# Patient Record
Sex: Male | Born: 1955 | Race: White | Hispanic: No | Marital: Single | Smoking: Never smoker
Health system: Southern US, Community
[De-identification: ages and names within clinical notes are randomized; demographics above are authoritative.]

## PROBLEM LIST (undated history)

## (undated) DIAGNOSIS — E785 Hyperlipidemia, unspecified: Secondary | ICD-10-CM

## (undated) DIAGNOSIS — R41841 Cognitive communication deficit: Secondary | ICD-10-CM

## (undated) DIAGNOSIS — I96 Gangrene, not elsewhere classified: Secondary | ICD-10-CM

## (undated) DIAGNOSIS — F419 Anxiety disorder, unspecified: Secondary | ICD-10-CM

## (undated) DIAGNOSIS — E119 Type 2 diabetes mellitus without complications: Secondary | ICD-10-CM

## (undated) DIAGNOSIS — F32A Depression, unspecified: Secondary | ICD-10-CM

## (undated) DIAGNOSIS — M4800 Spinal stenosis, site unspecified: Secondary | ICD-10-CM

## (undated) DIAGNOSIS — M1990 Unspecified osteoarthritis, unspecified site: Secondary | ICD-10-CM

## (undated) DIAGNOSIS — T07XXXA Unspecified multiple injuries, initial encounter: Secondary | ICD-10-CM

## (undated) DIAGNOSIS — I10 Essential (primary) hypertension: Secondary | ICD-10-CM

## (undated) DIAGNOSIS — R269 Unspecified abnormalities of gait and mobility: Secondary | ICD-10-CM

## (undated) DIAGNOSIS — G4733 Obstructive sleep apnea (adult) (pediatric): Secondary | ICD-10-CM

## (undated) DIAGNOSIS — R296 Repeated falls: Secondary | ICD-10-CM

## (undated) HISTORY — DX: Hyperlipidemia, unspecified: E78.5

## (undated) HISTORY — DX: Obstructive sleep apnea (adult) (pediatric): G47.33

## (undated) HISTORY — DX: Unspecified multiple injuries, initial encounter: T07.XXXA

## (undated) HISTORY — DX: Type 2 diabetes mellitus without complications: E11.9

## (undated) HISTORY — DX: Essential (primary) hypertension: I10

## (undated) HISTORY — DX: Gangrene, not elsewhere classified: I96

## (undated) HISTORY — DX: Unspecified abnormalities of gait and mobility: R26.9

## (undated) HISTORY — DX: Repeated falls: R29.6

## (undated) HISTORY — DX: Cognitive communication deficit: R41.841

## (undated) HISTORY — DX: Anxiety disorder, unspecified: F41.9

## (undated) HISTORY — DX: Spinal stenosis, site unspecified: M48.00

---

## 2006-01-03 ENCOUNTER — Ambulatory Visit (HOSPITAL_COMMUNITY): Admission: RE | Admit: 2006-01-03 | Discharge: 2006-01-03 | Payer: Self-pay | Admitting: General Surgery

## 2006-04-20 ENCOUNTER — Ambulatory Visit (HOSPITAL_COMMUNITY): Admission: RE | Admit: 2006-04-20 | Discharge: 2006-04-20 | Payer: Self-pay | Admitting: General Surgery

## 2021-11-18 ENCOUNTER — Encounter (HOSPITAL_COMMUNITY): Payer: Self-pay

## 2021-11-18 ENCOUNTER — Emergency Department (HOSPITAL_COMMUNITY): Payer: Medicare PPO

## 2021-11-18 ENCOUNTER — Emergency Department (HOSPITAL_COMMUNITY)
Admission: EM | Admit: 2021-11-18 | Discharge: 2021-11-18 | Disposition: A | Discharge status: Home / Self Care | Payer: Medicare PPO | Attending: Emergency Medicine | Admitting: Emergency Medicine

## 2021-11-18 ENCOUNTER — Other Ambulatory Visit: Payer: Self-pay

## 2021-11-18 DIAGNOSIS — E871 Hypo-osmolality and hyponatremia: Secondary | ICD-10-CM | POA: Insufficient documentation

## 2021-11-18 DIAGNOSIS — M87051 Idiopathic aseptic necrosis of right femur: Secondary | ICD-10-CM | POA: Insufficient documentation

## 2021-11-18 DIAGNOSIS — L089 Local infection of the skin and subcutaneous tissue, unspecified: Secondary | ICD-10-CM | POA: Diagnosis present

## 2021-11-18 DIAGNOSIS — M87077 Idiopathic aseptic necrosis of right toe(s): Secondary | ICD-10-CM | POA: Diagnosis not present

## 2021-11-18 DIAGNOSIS — E119 Type 2 diabetes mellitus without complications: Secondary | ICD-10-CM | POA: Insufficient documentation

## 2021-11-18 DIAGNOSIS — M25551 Pain in right hip: Secondary | ICD-10-CM | POA: Insufficient documentation

## 2021-11-18 DIAGNOSIS — M25571 Pain in right ankle and joints of right foot: Secondary | ICD-10-CM | POA: Insufficient documentation

## 2021-11-18 DIAGNOSIS — D72829 Elevated white blood cell count, unspecified: Secondary | ICD-10-CM | POA: Insufficient documentation

## 2021-11-18 DIAGNOSIS — L02419 Cutaneous abscess of limb, unspecified: Secondary | ICD-10-CM

## 2021-11-18 DIAGNOSIS — L03119 Cellulitis of unspecified part of limb: Secondary | ICD-10-CM

## 2021-11-18 LAB — BASIC METABOLIC PANEL
Anion gap: 10 (ref 5–15)
BUN: 19 mg/dL (ref 8–23)
CO2: 25 mmol/L (ref 22–32)
Calcium: 8.5 mg/dL — ABNORMAL LOW (ref 8.9–10.3)
Chloride: 97 mmol/L — ABNORMAL LOW (ref 98–111)
Creatinine, Ser: 0.81 mg/dL (ref 0.61–1.24)
GFR, Estimated: >60 mL/min (ref >60)
Glucose, Bld: 139 mg/dL — ABNORMAL HIGH (ref 70–99)
Potassium: 4.1 mmol/L (ref 3.5–5.1)
Sodium: 132 mmol/L — ABNORMAL LOW (ref 135–145)

## 2021-11-18 LAB — CBC WITH DIFFERENTIAL/PLATELET
Abs Immature Granulocytes: 0.7 10*3/uL — ABNORMAL HIGH (ref 0.00–0.07)
Basophils Absolute: 0.2 10*3/uL — ABNORMAL HIGH (ref 0.0–0.1)
Basophils Relative: 1 %
Eosinophils Absolute: 0.1 10*3/uL (ref 0.0–0.5)
Eosinophils Relative: 1 %
HCT: 48.9 % (ref 39.0–52.0)
Hemoglobin: 16.3 g/dL (ref 13.0–17.0)
Immature Granulocytes: 3 %
Lymphocytes Relative: 9 %
Lymphs Abs: 1.9 10*3/uL (ref 0.7–4.0)
MCH: 29 pg (ref 26.0–34.0)
MCHC: 33.3 g/dL (ref 30.0–36.0)
MCV: 87 fL (ref 80.0–100.0)
Monocytes Absolute: 1.2 10*3/uL — ABNORMAL HIGH (ref 0.1–1.0)
Monocytes Relative: 6 %
Neutro Abs: 17.7 10*3/uL — ABNORMAL HIGH (ref 1.7–7.7)
Neutrophils Relative %: 80 %
Platelets: 446 10*3/uL — ABNORMAL HIGH (ref 150–400)
RBC: 5.62 MIL/uL (ref 4.22–5.81)
RDW: 13 % (ref 11.5–15.5)
WBC: 21.8 10*3/uL — ABNORMAL HIGH (ref 4.0–10.5)
nRBC: 0 % (ref 0.0–0.2)

## 2021-11-18 LAB — LACTIC ACID, PLASMA
Lactic Acid, Venous: 1.8 mmol/L (ref 0.5–1.9)
Lactic Acid, Venous: 1.3 mmol/L (ref 0.5–1.9)

## 2021-11-18 LAB — SEDIMENTATION RATE: Sed Rate: 31 mm/hr — ABNORMAL HIGH (ref 0–16)

## 2021-11-18 MED ORDER — CLINDAMYCIN HCL 150 MG PO CAPS
300.0000 mg | ORAL_CAPSULE | Freq: Once | ORAL | Status: AC
  Administered 2021-11-18: 300 mg via ORAL
  Filled 2021-11-18: qty 2

## 2021-11-18 MED ORDER — VANCOMYCIN HCL IN DEXTROSE 1-5 GM/200ML-% IV SOLN
1000.0000 mg | Freq: Once | INTRAVENOUS | Status: AC
  Administered 2021-11-18: 1000 mg via INTRAVENOUS
  Filled 2021-11-18: qty 200

## 2021-11-18 MED ORDER — PIPERACILLIN-TAZOBACTAM 3.375 G IVPB: 3.3750 g | Freq: Three times a day (TID) | INTRAVENOUS | Status: DC

## 2021-11-18 MED ORDER — METRONIDAZOLE 500 MG/100ML IV SOLN
500.0000 mg | Freq: Once | INTRAVENOUS | Status: AC
Start: 2021-11-18 — End: 2021-11-18
  Administered 2021-11-18: 500 mg via INTRAVENOUS
  Filled 2021-11-18: qty 100

## 2021-11-18 MED ORDER — PIPERACILLIN-TAZOBACTAM 4.5 G IVPB
4.5000 g | Freq: Once | INTRAVENOUS | Status: DC
Start: 2021-11-18 — End: 2021-11-19
  Filled 2021-11-18: qty 100

## 2021-11-18 MED ORDER — IOHEXOL 300 MG/ML  SOLN
100.0000 mL | Freq: Once | INTRAMUSCULAR | Status: AC | PRN
  Administered 2021-11-18: 100 mL via INTRAVENOUS

## 2021-11-18 MED ORDER — SODIUM CHLORIDE 0.9 % IV SOLN: Freq: Once | INTRAVENOUS | Status: DC

## 2021-11-18 MED ORDER — SODIUM CHLORIDE 0.9 % IV SOLN
2.0000 g | Freq: Once | INTRAVENOUS | Status: AC
  Administered 2021-11-18: 2 g via INTRAVENOUS
  Filled 2021-11-18: qty 12.5

## 2021-11-18 NOTE — ED Notes (Signed)
Patient transported to CT 

## 2021-11-18 NOTE — Consult Note (Signed)
Consulted for admission in regards to right hip, heel, and right big toe wounds that were noted on admission to ALF.  CT imaging of the hip demonstrates some superficial gas and fluid collections.  Patient noted to have significant leukocytosis and was started on cefepime and vancomycin.  Asked ED provider to discuss case with orthopedics who states that this could be significant for necrotizing fasciitis and request that patient be transferred to [facility] for further evaluation and management.  Total care time: 25 minutes.

## 2021-11-18 NOTE — ED Provider Notes (Cosign Needed Addendum)
[facility] EMERGENCY DEPARTMENT Provider Note   CSN: [id number] Arrival date & time: 11/18/21  1403     History  Chief Complaint  Patient presents with   Lesions on Body    Curtis Hansen is a 66 y.o. male.  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  They are unsure of how long this has been present but patient believes the toe lesion has been present for about a month.  He denies fever, chills or other complaints.  Patient was getting a shower at the nursing home facility when they noted these findings and had him come to the emergency room for further evaluation.  The history is provided by the patient. No language interpreter was used.       Home Medications Prior to Admission medications   Not on File      Allergies    Patient has no allergy information on record.    Review of Systems   Review of Systems  Constitutional:  Negative for chills and fever.  Musculoskeletal:  Positive for arthralgias.  Skin:  Positive for wound.  All other systems reviewed and are negative.   Physical Exam Updated Vital Signs BP 109/86   Pulse 88   Temp 97.7 F (36.5 C) (Oral)   Resp (!) 21   Ht '5\' 10"'$  (1.778 m)   Wt 86.2 kg   SpO2 100%   BMI 27.26 kg/m  Physical Exam Vitals and nursing note reviewed.  Constitutional:      General: He is not in acute distress.    Appearance: Normal appearance. He is not ill-appearing.  HENT:     Head: Normocephalic and atraumatic.     Nose: Nose normal.  Eyes:     General: No scleral icterus.    Extraocular Movements: Extraocular movements intact.     Conjunctiva/sclera: Conjunctivae normal.  Cardiovascular:     Rate and Rhythm: Normal rate and regular rhythm.     Pulses: Normal pulses.     Heart sounds:  Normal heart sounds.  Pulmonary:     Effort: Pulmonary effort is normal. No respiratory distress.     Breath sounds: Normal breath sounds. No wheezing or rales.  Abdominal:     General: There is no distension.     Tenderness: There is no abdominal tenderness.  Musculoskeletal:        General: Normal range of motion.     Cervical back: Normal range of motion.  Skin:    General: Skin is warm and dry.     Comments: Wound noted to right great toe.  Necrotic area.  Ulcer noted to right hip.  Unstageable.  Heel wound noted.  See attached images for further description.  Erythema noted to right groin.  Neurological:     General: No focal deficit present.     Mental Status: He is alert. Mental status is at baseline.             ED Results / Procedures / Treatments   Labs (all labs ordered are listed, but only abnormal results are displayed) Labs Reviewed  CBC WITH DIFFERENTIAL/PLATELET  BASIC METABOLIC PANEL  SEDIMENTATION RATE  LACTIC ACID, PLASMA  LACTIC ACID, PLASMA    EKG None  Radiology No results found.  Procedures Procedures  Medications Ordered in ED Medications - No data to display  ED Course/ Medical Decision Making/ A&P                           Medical Decision Making Amount and/or Complexity of Data Reviewed Labs: ordered. Radiology: ordered.  Risk Prescription drug management. Decision regarding hospitalization.   66 year old male with past medical history of diabetes presents today from nursing home facility for evaluation of necrosis of the right great toe, right hip for unknown duration.  Patient was undergoing admission evaluation at the nursing home with these wounds were discovered and he was referred to the emergency room for further evaluation.  He denies fever or pain.  CBC shows leukocytosis of 21.8 with left shift.  BMP shows glucose of 139 otherwise without acute findings.  Lactic acid initially of 1.8.  Sed rate of 31.  Right foot  x-ray did not show any bony injury.  Right hip x-ray showed gas bubbles around the right hip.  This was followed up with CT pelvis with contrast which showed superficial gas.  Initially the case was discussed with hospitalist who accepted patient for admission and recommended discussion with Ortho on-call.  Orthopedist states given the presence of gas this is concerning for necrotizing fasciitis and patient can deteriorate suddenly and he recommends transfer to [facility].  Case was discussed with on-call general surgeon at [facility] who accepts patient as a transfer to [facility] as an ED to ED.  Notified nurse to ensure patient travels with disc of the CT scan as well as the CT read.  Antibiotics started.  Maintenance fluids started.  Patient and brother who is at bedside are aware of this.   Final Clinical Impression(s) / ED Diagnoses Final diagnoses:  Soft tissue infection    Rx / DC Orders ED Discharge Orders     None         Evlyn Courier, PA-C 11/18/21 2005    [address], [place], [place] 11/18/21 2053

## 2021-11-18 NOTE — Progress Notes (Signed)
Pharmacy Antibiotic Note  Curtis Hansen is a 66 y.o. male admitted on 11/18/2021 with  necrotizing fasciitis .  Pharmacy has been consulted for zosyn dosing.  Plan: Zosyn 4.5 gram iv X1 dose f/b Zosyn 3.375g IV q8h (4 hour infusion).  Height: '5\' 10"'$  (177.8 cm) Weight: 86.2 kg (190 lb) IBW/kg (Calculated) : 73  Temp (24hrs), Avg:97.7 F (36.5 C), Min:97.7 F (36.5 C), Max:97.7 F (36.5 C)  Recent Labs  Lab 11/18/21 1601 11/18/21 1603 11/18/21 1807  WBC 21.8*  --   --   CREATININE 0.81  --   --   LATICACIDVEN  --  1.8 1.3    Estimated Creatinine Clearance: 93.9 mL/min (by C-G formula based on SCr of 0.81 mg/dL).    Not on File  Antimicrobials this admission: 6/21 cefepime >> 6/21 6/21 clindamycin >> 6/21 6/21 flagyl >> 6/21 6/21 Vanco >> 6/21 6/21 Zosyn >>   Thank you for allowing pharmacy to be a part of this patient's care.  Vaughan Basta BS, PharmD, BCPS Clinical Pharmacist 11/18/2021 7:52 PM  Contact: [phone] after 3 PM  "Be curious, not judgmental..." -Jamal Maes

## 2021-11-18 NOTE — ED Triage Notes (Signed)
Patient brought by [facility] from [facility] after being evaluated for admission to the facility.  Staff was giving the patient a shower and noticed the patient had lesions on his right hip, right heel, and right big toe.  Wanted the patient to be evaluated before admission.

## 2022-02-15 ENCOUNTER — Encounter (INDEPENDENT_AMBULATORY_CARE_PROVIDER_SITE_OTHER): Payer: Self-pay

## 2022-02-15 ENCOUNTER — Ambulatory Visit (INDEPENDENT_AMBULATORY_CARE_PROVIDER_SITE_OTHER): Payer: Medicare Other | Admitting: Gastroenterology

## 2022-02-15 ENCOUNTER — Encounter (INDEPENDENT_AMBULATORY_CARE_PROVIDER_SITE_OTHER): Payer: Self-pay | Admitting: Gastroenterology

## 2022-02-15 VITALS — BP 145/87 | HR 88 | Temp 98.4°F | Ht 70.0 in | Wt 180.0 lb

## 2022-02-15 DIAGNOSIS — K5900 Constipation, unspecified: Secondary | ICD-10-CM | POA: Diagnosis not present

## 2022-02-15 DIAGNOSIS — Z1211 Encounter for screening for malignant neoplasm of colon: Secondary | ICD-10-CM | POA: Diagnosis not present

## 2022-02-15 DIAGNOSIS — Z86010 Personal history of colonic polyps: Secondary | ICD-10-CM

## 2022-02-15 NOTE — Progress Notes (Signed)
Referring Provider: No ref. provider found Primary Care Physician:  Moss Mc, NP Primary GI Physician: new  Chief Complaint  Patient presents with   Abdominal Pain    [facility]has a lot of gas.    HPI:   Curtis Hansen is a 65 y.o. male with past medical history of anxiety, DM, HLD, HTN, OSA, spinal stenosis  Patient presenting today as a new patient for abdominal pain and constipation.   Most recent labs: 11/27/21 with unremarkable CBC, hgb 14.8, 11/24/21 TSH 4.26  recent KUB with presence of moderate amount of stool in colon. Current med list with lactulose 10g TID with PRN dulcolax '10mg'$  suppository, tramadol '50mg'$  q6h, and oxycodone '5mg'$  tablets q6h.  Patient reports constipation and feeling gassy for the past few months. Has gone as long as a week without a BM. He does endorse that he has had changes in his medications here and there but unable to endorse which ones these were. States that he had been told he could get a suppository daily to help with a BM, Saturday he received a suppository and he had 6 good BMs. Lactulose is listed on med list for TID but he states he is only getting this once a day, did not get it Saturday and Sunday. He had a couple smaller BMs yesterday. Denies rectal bleeding or melena. Has some lower abdominal pain that improves with a BM. He does report he gets weighed at the beginning of each month, this month he had a loss of 10 pounds, states he does get his friends to bring him food. He notes he went to [facility] 11/28/21, has had decrease in clothes size over the past 6 months. Does endorse he has had a lot of issues with cellulitis and wounds over the past few months which is why he had to go to nursing facility. States appetite is okay though is somewhat less than when he initially went to nursing facility. He states that he tries to avoid taking oxycodone or tramadol anymore than he  needs to, per patient takes this maybe a couple of times per week, though per mar it appears he takes this multiple days per week. Denies nausea or vomiting, dysphagia or odynophagia.   He reports that he had a colonoscopy 2 years ago and was advised to have it repeated in 2 years due to polyps, no report available though per chart review has history of colonic adenomas .   NSAID use: none Social hx: no etoh or tobacco  Fam hx: no crc, lvier disease, or pancreatic cancer   Last Colonoscopy: Dr. Anne Ng, [place], 2 years ago per patient, recommended to have repeat in 2 years  Last Endoscopy: never  Recommendations:    Past Medical History:  Diagnosis Date   Abnormality of gait    Anxiety    Cognitive communication deficit    Diabetes ([place])    Gangrene ([place])    Hyperlipemia    Hypertension    Multiple fractures    right ribs   OSA (obstructive sleep apnea)    Repeated falls    Spinal stenosis     No past surgical history on file.  Current Outpatient Medications  Medication Sig Dispense Refill   ACETAMINOPHEN ER PO Take 325 mg by mouth. Take 2 tablets every 6 hours prn     aspirin EC 81 MG tablet Take 81 mg by mouth daily.  atorvastatin (LIPITOR) 40 MG tablet Take 1 tablet by mouth at bedtime.     bisacodyl (DULCOLAX) 10 MG suppository Place 10 mg rectally daily as needed for moderate constipation.     cetirizine (ZYRTEC) 10 MG tablet Take by mouth.     clopidogrel (PLAVIX) 75 MG tablet Take 75 mg by mouth daily.     fenofibrate 160 MG tablet Take 1 tablet by mouth daily.     insulin lispro (HUMALOG) 100 UNIT/ML injection Inject into the skin 3 (three) times daily before meals. Sliding scale     lactulose (CHRONULAC) 10 GM/15ML solution Take by mouth 3 (three) times daily. 30 ml one time daily for constipation     lidocaine (LIDODERM) 5 % Place 1 patch onto the skin daily. Remove & Discard patch within 12 hours or as directed by MD     metoprolol succinate  (TOPROL-XL) 25 MG 24 hr tablet Take 12.5 mg by mouth daily.     Nutritional Supplements (ENSURE PO) Take by mouth. One three times a day - 8 ounces     OVER THE COUNTER MEDICATION Melatonin '3mg'$  one qhs     OxyCODONE HCl, Abuse Deter, (OXAYDO) 5 MG TABA Take by mouth. One every 6 hours prn     pregabalin (LYRICA) 75 MG capsule Take by mouth.     Semaglutide (RYBELSUS) 7 MG TABS Take by mouth.     senna (SENOKOT) 8.6 MG TABS tablet Take 1 tablet by mouth. 2 bid     simethicone (MYLICON) 80 MG chewable tablet Chew 80 mg by mouth. One tablet by mouth before meals and bedtime     sodium chloride 1 g tablet Take 1 g by mouth daily.     tamsulosin (FLOMAX) 0.4 MG CAPS capsule tamsulosin 0.4 mg capsule  TAKE ONE CAPSULE BY MOUTH once daily     traMADol (ULTRAM) 50 MG tablet Take 1 tablet by mouth every 6 (six) hours as needed.     traZODone (DESYREL) 50 MG tablet Take 50 mg by mouth at bedtime.     zinc gluconate 50 MG tablet Take 50 mg by mouth daily.     No current facility-administered medications for this visit.    Allergies as of 02/15/2022   (No Known Allergies)    No family history on file.  Social History   Socioeconomic History   Marital status: Single    Spouse name: Not on file   Number of children: Not on file   Years of education: Not on file   Highest education level: Not on file  Occupational History   Not on file  Tobacco Use   Smoking status: Never    Passive exposure: Never   Smokeless tobacco: Never  Substance and Sexual Activity   Alcohol use: Never   Drug use: Never   Sexual activity: Not on file  Other Topics Concern   Not on file  Social History Narrative   Not on file   Social Determinants of Health   Financial Resource Strain: Not on file  Food Insecurity: Not on file  Transportation Needs: Not on file  Physical Activity: Not on file  Stress: Not on file  Social Connections: Not on file   Review of systems General: negative for malaise, night  sweats, fever, chills, +weight loss Neck: Negative for lumps, goiter, pain and significant neck swelling Resp: Negative for cough, wheezing, dyspnea at rest CV: Negative for chest pain, leg swelling, palpitations, orthopnea GI: denies melena, hematochezia, nausea, vomiting,  diarrhea, dysphagia, odyonophagia, early satiety or unintentional weight loss. +constipation +flatulence MSK: Negative for joint pain or swelling, back pain, and muscle pain. Derm: Negative for itching or rash Psych: Denies depression, anxiety, memory loss, confusion. No homicidal or suicidal ideation.  Heme: Negative for prolonged bleeding, bruising easily, and swollen nodes. Endocrine: Negative for cold or heat intolerance, polyuria, polydipsia and goiter. Neuro: negative for tremor, gait imbalance, syncope and seizures. The remainder of the review of systems is noncontributory.  Physical Exam: BP (!) 145/87 (BP Location: Left Arm, Patient Position: Sitting, Cuff Size: Large)   Pulse 88   Temp 98.4 F (36.9 C) (Oral)   Ht '5\' 10"'$  (1.778 m)   Wt 180 lb (81.6 kg) Comment: per patient. unable to weight today.  BMI 25.83 kg/m  General:   Alert and oriented. No distress noted. Pleasant and cooperative. Pt in wheelchair Head:  Normocephalic and atraumatic. Eyes:  Conjuctiva clear without scleral icterus. Mouth:  Oral mucosa pink and moist. Good dentition. No lesions. Heart: Normal rate and rhythm, s1 and s2 heart sounds present.  Lungs: Clear lung sounds in all lobes. Respirations equal and unlabored. Abdomen:  +BS, soft, non-tender and non-distended. No rebound or guarding. No HSM or masses noted. Derm: No palmar erythema or jaundice Msk:  Symmetrical without gross deformities. Normal posture. Extremities:  Without edema. Neurologic:  Alert and  oriented x4 Psych:  Alert and cooperative. Normal mood and affect.  Invalid input(s): "6 MONTHS"   ASSESSMENT: EEAN BUSS is a 66 y.o. male presenting today as a new  patient for constipation and to schedule colonoscopy.  Constipation for the past few months, notably on opiate pain medications that he appears to be taking multiple times per week. Having BMs only with the use of dulcolax suppositories. No rectal bleeding or melena. Abdominal pain that improves with BM. Recent Thyroid panel was WNL. Suspect constipation is opiate induced. Discussed starting miralax 17g BID x1 week, if no improvement in frequency of BMs, recommend increasing to 17g q8h. Can use dulcolax suppositories PRN, however, if he is not having improved frequency in BMs, may need to consider prescription strength constipation treatment such as Movantik given opiates.   Patient reports last colonoscopy was 2 years ago and was told to repeat again in 2 years due to polyps, no report available though per chart review, history of colonic adenomas. Will proceed with screening colonoscopy.   Indications, risks and benefits of procedure discussed in detail with patient. Patient verbalized understanding and is in agreement to proceed with Colonoscopy at this time.    PLAN:  Schedule colonoscopy, 2 day prep, ASA III 2. Start taking Miralax 1 capful  q12 hours for one week. If bowel movements do not improve, increase to 1 capful every 8 hours.  3. Dulcolax suppository PRN 4. Consider Movantik if miralax is not providing results  5. Increase water and fiber intake  All questions were answered, patient verbalized understanding and is in agreement with plan as outlined above.    Follow Up: 6 months   Jonnae Fonseca L. Alver Sorrow, MSN, APRN, AGNP-C Adult-Gerontology Nurse Practitioner [facility]

## 2022-02-15 NOTE — Patient Instructions (Signed)
It was nice to meet you! We will get you scheduled for colonoscopy Start taking Miralax 17g 12 hours. If after 1 week there is no improvement, increase to 1 capful every 8 hours Please make sure you are staying well hydrated and eating diet high in fiber as you are able  Follow up 6 months

## 2022-02-17 ENCOUNTER — Telehealth (INDEPENDENT_AMBULATORY_CARE_PROVIDER_SITE_OTHER): Payer: Self-pay

## 2022-02-17 NOTE — Telephone Encounter (Signed)
Per Dr Renard Matter at [facility] dob 01-19-2056 may hold Plavix 5 days prior to procedure and patient is aware

## 2022-03-10 ENCOUNTER — Other Ambulatory Visit (HOSPITAL_COMMUNITY): Payer: Self-pay | Admitting: Orthopedic Surgery

## 2022-03-10 ENCOUNTER — Ambulatory Visit (HOSPITAL_COMMUNITY)
Admission: RE | Admit: 2022-03-10 | Discharge: 2022-03-10 | Disposition: A | Discharge status: Home / Self Care | Payer: Medicare Other | Source: Ambulatory Visit | Attending: Orthopedic Surgery | Admitting: Orthopedic Surgery

## 2022-03-10 DIAGNOSIS — R531 Weakness: Secondary | ICD-10-CM

## 2022-03-31 ENCOUNTER — Other Ambulatory Visit (INDEPENDENT_AMBULATORY_CARE_PROVIDER_SITE_OTHER): Payer: Self-pay

## 2022-03-31 DIAGNOSIS — Z1211 Encounter for screening for malignant neoplasm of colon: Secondary | ICD-10-CM

## 2022-04-01 ENCOUNTER — Other Ambulatory Visit (HOSPITAL_COMMUNITY): Payer: Self-pay | Admitting: Neurological Surgery

## 2022-04-01 ENCOUNTER — Encounter (HOSPITAL_COMMUNITY)
Admission: RE | Admit: 2022-04-01 | Discharge: 2022-04-01 | Disposition: A | Discharge status: Home / Self Care | Payer: Medicare Other | Source: Ambulatory Visit | Attending: Gastroenterology | Admitting: Gastroenterology

## 2022-04-01 ENCOUNTER — Encounter (HOSPITAL_COMMUNITY): Payer: Self-pay

## 2022-04-01 ENCOUNTER — Other Ambulatory Visit: Payer: Self-pay

## 2022-04-01 DIAGNOSIS — M47816 Spondylosis without myelopathy or radiculopathy, lumbar region: Secondary | ICD-10-CM

## 2022-04-01 NOTE — Pre-Procedure Instructions (Signed)
Called GI office. Spoke with leigh ann. Will call facility to reschedule procedure.

## 2022-04-01 NOTE — Progress Notes (Signed)
Called [facility] to speak to nurse regarding pat information for pt. Placed on hold. No answer. Will call at a later time.

## 2022-04-01 NOTE — Progress Notes (Signed)
Spoke to [employer]. Stated facility nurse gave pt his plavix today at 8 am. Was supposed to stop medication on 03/31/22. Jenetta Downer MD notified. Stated pt will need to be rescheduled for procedure and told to hold plavix for 5 days. Will called facility and let them know.

## 2022-04-01 NOTE — Progress Notes (Signed)
Spoke with Martin Majestic regarding pt's pat phone call. Dicussed information. Faxed letter for colon prep information to her. Verbalized understanding of information. No questions at this time.

## 2022-04-01 NOTE — Progress Notes (Signed)
Spoke to Dr. Jenetta Downer regarding pt taking plavix. He is ok with pt taking medication today and will proceed with procedure

## 2022-04-01 NOTE — Progress Notes (Signed)
Spoke with alison at [facility] and made her aware that Dr. Jenetta Downer is okay with proceeding with procedure. Called Leigh ann at GI office. She is also aware.

## 2022-04-05 ENCOUNTER — Telehealth (INDEPENDENT_AMBULATORY_CARE_PROVIDER_SITE_OTHER): Payer: Self-pay | Admitting: Gastroenterology

## 2022-04-05 NOTE — Telephone Encounter (Signed)
Cathy from [facility] left voice mail message inquiring about a procedure for this patient - please advise - ph#  [phone]

## 2022-04-05 NOTE — Telephone Encounter (Signed)
Called [facility] and was advised T[facility] [place] not currently available.

## 2022-04-05 NOTE — Telephone Encounter (Signed)
Called [facility] and line rang numerous times, no answer and not able to leave VM

## 2022-04-06 ENCOUNTER — Encounter (INDEPENDENT_AMBULATORY_CARE_PROVIDER_SITE_OTHER): Payer: Self-pay | Admitting: *Deleted

## 2022-04-06 ENCOUNTER — Ambulatory Visit (HOSPITAL_BASED_OUTPATIENT_CLINIC_OR_DEPARTMENT_OTHER): Payer: Medicare Other | Admitting: Anesthesiology

## 2022-04-06 ENCOUNTER — Encounter (HOSPITAL_COMMUNITY): Payer: Self-pay | Admitting: Gastroenterology

## 2022-04-06 ENCOUNTER — Ambulatory Visit (HOSPITAL_COMMUNITY): Payer: Medicare Other | Admitting: Anesthesiology

## 2022-04-06 ENCOUNTER — Encounter (HOSPITAL_COMMUNITY)
Admission: RE | Disposition: A | Discharge status: Skilled Nursing Facility | Payer: Self-pay | Source: Home / Self Care | Attending: Gastroenterology

## 2022-04-06 ENCOUNTER — Other Ambulatory Visit: Payer: Self-pay

## 2022-04-06 ENCOUNTER — Ambulatory Visit (HOSPITAL_COMMUNITY)
Admission: RE | Admit: 2022-04-06 | Discharge: 2022-04-06 | Disposition: A | Discharge status: Skilled Nursing Facility | Payer: Medicare Other | Attending: Gastroenterology | Admitting: Gastroenterology

## 2022-04-06 DIAGNOSIS — D120 Benign neoplasm of cecum: Secondary | ICD-10-CM | POA: Diagnosis not present

## 2022-04-06 DIAGNOSIS — Z8719 Personal history of other diseases of the digestive system: Secondary | ICD-10-CM | POA: Insufficient documentation

## 2022-04-06 DIAGNOSIS — Z86010 Personal history of colonic polyps: Secondary | ICD-10-CM | POA: Diagnosis not present

## 2022-04-06 DIAGNOSIS — Z794 Long term (current) use of insulin: Secondary | ICD-10-CM | POA: Diagnosis not present

## 2022-04-06 DIAGNOSIS — D123 Benign neoplasm of transverse colon: Secondary | ICD-10-CM | POA: Diagnosis not present

## 2022-04-06 DIAGNOSIS — K5730 Diverticulosis of large intestine without perforation or abscess without bleeding: Secondary | ICD-10-CM | POA: Insufficient documentation

## 2022-04-06 DIAGNOSIS — Z1211 Encounter for screening for malignant neoplasm of colon: Secondary | ICD-10-CM

## 2022-04-06 DIAGNOSIS — K649 Unspecified hemorrhoids: Secondary | ICD-10-CM | POA: Diagnosis not present

## 2022-04-06 DIAGNOSIS — K6389 Other specified diseases of intestine: Secondary | ICD-10-CM | POA: Insufficient documentation

## 2022-04-06 DIAGNOSIS — K635 Polyp of colon: Secondary | ICD-10-CM

## 2022-04-06 DIAGNOSIS — G4733 Obstructive sleep apnea (adult) (pediatric): Secondary | ICD-10-CM | POA: Diagnosis not present

## 2022-04-06 DIAGNOSIS — D124 Benign neoplasm of descending colon: Secondary | ICD-10-CM | POA: Diagnosis not present

## 2022-04-06 DIAGNOSIS — R296 Repeated falls: Secondary | ICD-10-CM | POA: Diagnosis not present

## 2022-04-06 DIAGNOSIS — I10 Essential (primary) hypertension: Secondary | ICD-10-CM | POA: Insufficient documentation

## 2022-04-06 DIAGNOSIS — D122 Benign neoplasm of ascending colon: Secondary | ICD-10-CM | POA: Insufficient documentation

## 2022-04-06 DIAGNOSIS — M4800 Spinal stenosis, site unspecified: Secondary | ICD-10-CM | POA: Insufficient documentation

## 2022-04-06 DIAGNOSIS — Z09 Encounter for follow-up examination after completed treatment for conditions other than malignant neoplasm: Secondary | ICD-10-CM | POA: Diagnosis not present

## 2022-04-06 DIAGNOSIS — E119 Type 2 diabetes mellitus without complications: Secondary | ICD-10-CM | POA: Diagnosis not present

## 2022-04-06 DIAGNOSIS — Z79899 Other long term (current) drug therapy: Secondary | ICD-10-CM | POA: Diagnosis not present

## 2022-04-06 HISTORY — PX: POLYPECTOMY: SHX5525

## 2022-04-06 HISTORY — PX: COLONOSCOPY WITH PROPOFOL: SHX5780

## 2022-04-06 HISTORY — PX: HEMOSTASIS CLIP PLACEMENT: SHX6857

## 2022-04-06 LAB — GLUCOSE, CAPILLARY
Glucose-Capillary: 72 mg/dL (ref 70–99)
Glucose-Capillary: 112 mg/dL — ABNORMAL HIGH (ref 70–99)

## 2022-04-06 LAB — HM COLONOSCOPY

## 2022-04-06 SURGERY — COLONOSCOPY WITH PROPOFOL
Anesthesia: General

## 2022-04-06 MED ORDER — LIDOCAINE HCL 1 % IJ SOLN
INTRAMUSCULAR | Status: DC | PRN
  Administered 2022-04-06: 50 mg via INTRADERMAL

## 2022-04-06 MED ORDER — PROPOFOL 500 MG/50ML IV EMUL
INTRAVENOUS | Status: DC | PRN
  Administered 2022-04-06: 150 ug/kg/min via INTRAVENOUS

## 2022-04-06 MED ORDER — LIDOCAINE HCL (PF) 2 % IJ SOLN
INTRAMUSCULAR | Status: AC
  Filled 2022-04-06: qty 10

## 2022-04-06 MED ORDER — FENTANYL CITRATE (PF) 100 MCG/2ML IJ SOLN
INTRAMUSCULAR | Status: AC
  Filled 2022-04-06: qty 2

## 2022-04-06 MED ORDER — DEXTROSE 50 % IV SOLN
INTRAVENOUS | Status: AC
  Filled 2022-04-06: qty 50

## 2022-04-06 MED ORDER — DEXTROSE 50 % IV SOLN
50.0000 mL | Freq: Once | INTRAVENOUS | Status: AC
  Administered 2022-04-06: 50 mL via INTRAVENOUS

## 2022-04-06 MED ORDER — PROPOFOL 10 MG/ML IV BOLUS
INTRAVENOUS | Status: DC | PRN
  Administered 2022-04-06: 80 mg via INTRAVENOUS

## 2022-04-06 MED ORDER — LACTATED RINGERS IV SOLN: INTRAVENOUS | Status: DC

## 2022-04-06 MED ORDER — FENTANYL CITRATE (PF) 100 MCG/2ML IJ SOLN
INTRAMUSCULAR | Status: DC | PRN
  Administered 2022-04-06: 50 ug via INTRAVENOUS

## 2022-04-06 NOTE — Transfer of Care (Addendum)
Immediate Anesthesia Transfer of Care Note  Patient: Curtis Hansen  Procedure(s) Performed: COLONOSCOPY WITH PROPOFOL POLYPECTOMY HEMOSTASIS CLIP PLACEMENT  Patient Location: Short Stay  Anesthesia Type:General  Level of Consciousness: awake  Airway & Oxygen Therapy: Patient Spontanous Breathing  Post-op Assessment: Report given to RN  Post vital signs: Reviewed and stable  Last Vitals:  Vitals Value Taken Time  BP 161/88   Temp 36.3   Pulse 70   Resp 16   SpO2 100     Last Pain:  Vitals:   04/06/22 1003  TempSrc:   PainSc: 0-No pain      Patients Stated Pain Goal: 5 (65/68/12 7517)  Complications: No notable events documented.

## 2022-04-06 NOTE — Anesthesia Preprocedure Evaluation (Signed)
Anesthesia Evaluation  Patient identified by MRN, date of birth, ID band Patient awake    Reviewed: Allergy & Precautions, H&P , NPO status , Patient's Chart, lab work & pertinent test results, reviewed documented beta blocker date and time   Airway Mallampati: III  TM Distance: >3 FB Neck ROM: Full    Dental  (+) Dental Advisory Given   Pulmonary sleep apnea    Pulmonary exam normal breath sounds clear to auscultation       Cardiovascular Exercise Tolerance: Poor METS (wheel chair bound): hypertension, Pt. on medications and Pt. on home beta blockers Normal cardiovascular exam Rhythm:Regular Rate:Normal     Neuro/Psych  PSYCHIATRIC DISORDERS Anxiety      Neuromuscular disease (spinal stenosis)    GI/Hepatic negative GI ROS, Neg liver ROS,,,  Endo/Other  diabetes, Well Controlled, Type 2, Insulin Dependent, Oral Hypoglycemic Agents    Renal/GU negative Renal ROS  negative genitourinary   Musculoskeletal negative musculoskeletal ROS (+)  Multiple falls, rib fx   Abdominal   Peds negative pediatric ROS (+)  Hematology negative hematology ROS (+)   Anesthesia Other Findings Gangrene  Cognitive communication   Reproductive/Obstetrics negative OB ROS                             Anesthesia Physical Anesthesia Plan  ASA: 3  Anesthesia Plan: General   Post-op Pain Management: Minimal or no pain anticipated   Induction: Intravenous  PONV Risk Score and Plan: Propofol infusion  Airway Management Planned: Nasal Cannula and Natural Airway  Additional Equipment:   Intra-op Plan:   Post-operative Plan:   Informed Consent: I have reviewed the patients History and Physical, chart, labs and discussed the procedure including the risks, benefits and alternatives for the proposed anesthesia with the patient or authorized representative who has indicated his/her understanding and acceptance.      Dental advisory given  Plan Discussed with: CRNA and Surgeon  Anesthesia Plan Comments:         Anesthesia Quick Evaluation

## 2022-04-06 NOTE — Discharge Instructions (Signed)
You are being discharged to home.  Resume your previous diet.  We are waiting for your pathology results.  Your physician has recommended a repeat colonoscopy (date to be determined after pending pathology results are reviewed) for surveillance.  Restart Plavix in 3 days.

## 2022-04-06 NOTE — H&P (Signed)
Curtis Hansen is an 66 y.o. male.   Chief Complaint: history of colon polyps, incomplete colonoscopy HPI: 66 y.o. male with past medical history of anxiety, DM, HLD, HTN, OSA, spinal stenosis , coming for evaluation of history of colonic polyps and incomplete colonoscopy.  Last colonoscopy was performed 2 years ago and was advised to repeat in 2 years as he had polyps but no report able.  He reports that the prep was negative.  The patient denies having any complaints such as melena, hematochezia, abdominal pain or distention, change in her bowel movement consistency or frequency, no changes in weight recently.  No family history of colorectal cancer.   Past Medical History:  Diagnosis Date   Abnormality of gait    Anxiety    Cognitive communication deficit    Diabetes ([place])    Gangrene ([place])    Hyperlipemia    Hypertension    Multiple fractures    right ribs   OSA (obstructive sleep apnea)    Repeated falls    Spinal stenosis     History reviewed. No pertinent surgical history.  History reviewed. No pertinent family history. Social History:  reports that he has never smoked. He has never been exposed to tobacco smoke. He has never used smokeless tobacco. He reports that he does not drink alcohol and does not use drugs.  Allergies: No Known Allergies  Medications Prior to Admission  Medication Sig Dispense Refill   ACETAMINOPHEN ER PO Take 325 mg by mouth. Take 2 tablets every 6 hours prn     aspirin EC 81 MG tablet Take 81 mg by mouth daily.     atorvastatin (LIPITOR) 40 MG tablet Take 1 tablet by mouth at bedtime.     fenofibrate 160 MG tablet Take 1 tablet by mouth daily.     insulin lispro (HUMALOG) 100 UNIT/ML injection Inject into the skin 3 (three) times daily before meals. Sliding scale     lactulose (CHRONULAC) 10 GM/15ML solution Take by mouth 3 (three) times daily. 30 ml one time daily for constipation     lidocaine (LIDODERM) 5 % Place 1 patch onto the skin daily.  Remove & Discard patch within 12 hours or as directed by MD     metoprolol succinate (TOPROL-XL) 25 MG 24 hr tablet Take 12.5 mg by mouth daily.     Nutritional Supplements (ENSURE PO) Take by mouth. One three times a day - 8 ounces     OVER THE COUNTER MEDICATION Melatonin '3mg'$  one qhs     senna (SENOKOT) 8.6 MG TABS tablet Take 1 tablet by mouth. 2 bid     simethicone (MYLICON) 80 MG chewable tablet Chew 80 mg by mouth. One tablet by mouth before meals and bedtime     sodium chloride 1 g tablet Take 1 g by mouth daily.     tamsulosin (FLOMAX) 0.4 MG CAPS capsule tamsulosin 0.4 mg capsule  TAKE ONE CAPSULE BY MOUTH once daily     traZODone (DESYREL) 50 MG tablet Take 50 mg by mouth at bedtime.     zinc gluconate 50 MG tablet Take 50 mg by mouth daily.     bisacodyl (DULCOLAX) 10 MG suppository Place 10 mg rectally daily as needed for moderate constipation.     cetirizine (ZYRTEC) 10 MG tablet Take by mouth. (Patient not taking: Reported on 04/01/2022)     clopidogrel (PLAVIX) 75 MG tablet Take 75 mg by mouth daily.     OxyCODONE HCl, Abuse Deter, (  OXAYDO) 5 MG TABA Take by mouth. One every 6 hours prn     pregabalin (LYRICA) 75 MG capsule Take by mouth.     Semaglutide (RYBELSUS) 7 MG TABS Take by mouth.     traMADol (ULTRAM) 50 MG tablet Take 1 tablet by mouth every 6 (six) hours as needed. (Patient not taking: Reported on 04/01/2022)      Results for orders placed or performed during the hospital encounter of 04/06/22 (from the past 48 hour(s))  Glucose, capillary     Status: None   Collection Time: 04/06/22  9:04 AM  Result Value Ref Range   Glucose-Capillary 72 70 - 99 mg/dL    Comment: Glucose reference range applies only to samples taken after fasting for at least 8 hours.   No results found.  Review of Systems  All other systems reviewed and are negative.   Blood pressure (!) 161/88, pulse 91, temperature 98.5 F (36.9 C), temperature source Oral, resp. rate 19, height '5\' 9"'$   (1.753 m), weight 87.1 kg, SpO2 97 %. Physical Exam  GENERAL: The patient is AO x3, in no acute distress. HEENT: Head is normocephalic and atraumatic. EOMI are intact. Mouth is well hydrated and without lesions. NECK: Supple. No masses LUNGS: Clear to auscultation. No presence of rhonchi/wheezing/rales. Adequate chest expansion HEART: RRR, normal s1 and s2. ABDOMEN: Soft, nontender, no guarding, no peritoneal signs, and nondistended. BS +. No masses.  EXTREMITIES: Without any cyanosis, clubbing, rash, lesions or edema. NEUROLOGIC: AOx3, no focal motor deficit. SKIN: no jaundice, no rashes  ASSESSMENT 66 y.o. male with past medical history of anxiety, DM, HLD, HTN, OSA, spinal stenosis , coming for evaluation of history of colonic polyps and incomplete colonoscopy.  We will proceed with colonoscopy.  Harvel Quale, MD 04/06/2022, 9:48 AM

## 2022-04-06 NOTE — Op Note (Signed)
[facility] Patient Name: Curtis Hansen Procedure Date: 04/06/2022 9:52 AM MRN: [id number] Date of Birth: Apr 08, 1956 Attending MD: Maylon Peppers , , [id number] CSN: [id number] Age: 66 Admit Type: Outpatient Procedure:                Colonoscopy Indications:              Surveillance: Personal history of colonic polyps                            with unknown histology on last colonoscopy                            (inadequate bowel preparation) less than 3 years ago Providers:                Maylon Peppers, Clenton Pare,                            Technician Referring MD:              Medicines:                Monitored Anesthesia Care Complications:            No immediate complications. Estimated Blood Loss:     Estimated blood loss: none. Procedure:                Pre-Anesthesia Assessment:                           - Prior to the procedure, a History and Physical                            was performed, and patient medications, allergies                            and sensitivities were reviewed. The patient's                            tolerance of previous anesthesia was reviewed.                           - The risks and benefits of the procedure and the                            sedation options and risks were discussed with the                            patient. All questions were answered and informed                            consent was obtained.                           - ASA Grade Assessment: III - A patient with severe                            systemic disease.  After obtaining informed consent, the colonoscope                            was passed under direct vision. Throughout the                            procedure, the patient's blood pressure, pulse, and                            oxygen saturations were monitored continuously. The                            [id number]-HQ190L ([id number]) scope was introduced  through                            the anus and advanced to the the cecum, identified                            by appendiceal orifice and ileocecal valve. The                            colonoscopy was performed with difficulty due to                            significant looping. The patient tolerated the                            procedure well. Scope In: 10:11:45 AM Scope Out: 11:08:22 AM Scope Withdrawal Time: 0 hours 42 minutes 53 seconds  Total Procedure Duration: 0 hours 56 minutes 37 seconds  Findings:      Hemorrhoids were found on perianal exam.      A 4 mm polyp was found in the cecum. The polyp was sessile. The polyp       was removed with a cold snare. Resection and retrieval were complete. To       prevent bleeding after the polypectomy, one hemostatic clip was       successfully placed. Clip manufacturer: [employer]. There was no       bleeding at the end of the procedure.      Four sessile polyps were found in the descending colon, transverse colon       and ascending colon. The polyps were 4 to 6 mm in size. These polyps       were removed with a cold snare. Resection and retrieval were complete.      A 12 mm polyp was found in the proximal transverse colon. The polyp was       semi-sessile. The polyp was removed with a hot snare. Resection and       retrieval were complete. To prevent bleeding after the polypectomy,       three hemostatic clips were successfully placed. Clip manufacturer:       [employer]. There was no bleeding at the end of the procedure.      Scattered medium-mouthed diverticula were found in the sigmoid colon.      A diffuse area of melanosis was found in the entire colon.      The  retroflexed view of the distal rectum and anal verge was normal and       showed no anal or rectal abnormalities. Impression:               - Hemorrhoids found on perianal exam.                           - One 4 mm polyp in the cecum, removed with a cold                             snare. Resected and retrieved. Clip was placed.                            Clip manufacturer: [employer].                           - Four 4 to 6 mm polyps in the descending colon, in                            the transverse colon and in the ascending colon,                            removed with a cold snare. Resected and retrieved.                           - One 12 mm polyp in the proximal transverse colon,                            removed with a hot snare. Resected and retrieved.                            Clips were placed. Clip manufacturer: [employer].                           - Diverticulosis in the sigmoid colon.                           - Melanosis in the colon.                           - The distal rectum and anal verge are normal on                            retroflexion view. Moderate Sedation:      Per Anesthesia Care Recommendation:           - Discharge patient to home (ambulatory).                           - Resume previous diet.                           -  Await pathology results.                           - Repeat colonoscopy date to be determined after                            pending pathology results are reviewed for                            surveillance.                           -Restart Plavix in 3 days. Procedure Code(s):        --- Professional ---                           [phone], Colonoscopy, flexible; with removal of                            tumor(s), polyp(s), or other lesion(s) by snare                            technique Diagnosis Code(s):        --- Professional ---                           Z86.010, Personal history of colonic polyps                           D12.0, Benign neoplasm of cecum                           D12.3, Benign neoplasm of transverse colon (hepatic                            flexure or splenic flexure)                           D12.4, Benign neoplasm  of descending colon                           D12.2, Benign neoplasm of ascending colon                           K64.9, Unspecified hemorrhoids                           K63.89, Other specified diseases of intestine                           K57.30, Diverticulosis of large intestine without                            perforation or abscess without bleeding CPT copyright 2022 American Medical Association. All rights reserved. The codes documented in this report are preliminary and upon coder review may  be revised to meet current compliance requirements. Maylon Peppers, MD Maylon Peppers,  04/06/2022 11:15:19 AM This report has been signed electronically. Number of Addenda: 0

## 2022-04-06 NOTE — Addendum Note (Signed)
Addendum  created 04/06/22 1549 by Ollen Bowl, CRNA   Clinical Note Signed

## 2022-04-06 NOTE — Anesthesia Postprocedure Evaluation (Signed)
Anesthesia Post Note  Patient: Curtis Hansen  Procedure(s) Performed: COLONOSCOPY WITH PROPOFOL POLYPECTOMY HEMOSTASIS CLIP PLACEMENT  Patient location during evaluation: Short Stay Anesthesia Type: General Level of consciousness: awake and alert Pain management: pain level controlled Vital Signs Assessment: post-procedure vital signs reviewed and stable Respiratory status: spontaneous breathing Cardiovascular status: blood pressure returned to baseline and stable Postop Assessment: no apparent nausea or vomiting Anesthetic complications: no   No notable events documented.   Last Vitals:  Vitals:   04/06/22 0913  BP: (!) 161/88  Pulse: 91  Resp: 19  Temp: 36.9 C  SpO2: 97%    Last Pain:  Vitals:   04/06/22 1003  TempSrc:   PainSc: 0-No pain                 Georjean Toya

## 2022-04-07 LAB — SURGICAL PATHOLOGY

## 2022-04-09 ENCOUNTER — Encounter (INDEPENDENT_AMBULATORY_CARE_PROVIDER_SITE_OTHER): Payer: Self-pay

## 2022-04-13 ENCOUNTER — Ambulatory Visit (HOSPITAL_COMMUNITY): Admission: RE | Admit: 2022-04-13 | Payer: Medicare Other | Source: Ambulatory Visit

## 2022-04-13 ENCOUNTER — Encounter (HOSPITAL_COMMUNITY): Payer: Self-pay | Admitting: Gastroenterology

## 2022-04-15 ENCOUNTER — Ambulatory Visit (HOSPITAL_COMMUNITY)
Admission: RE | Admit: 2022-04-15 | Discharge: 2022-04-15 | Disposition: A | Discharge status: Home / Self Care | Payer: Medicare Other | Source: Ambulatory Visit | Attending: Neurological Surgery | Admitting: Neurological Surgery

## 2022-04-15 DIAGNOSIS — M47816 Spondylosis without myelopathy or radiculopathy, lumbar region: Secondary | ICD-10-CM | POA: Diagnosis present

## 2022-04-15 MED ORDER — GADOBUTROL 1 MMOL/ML IV SOLN
10.0000 mL | Freq: Once | INTRAVENOUS | Status: AC | PRN
  Administered 2022-04-15: 10 mL via INTRAVENOUS

## 2022-04-28 ENCOUNTER — Other Ambulatory Visit (HOSPITAL_COMMUNITY): Payer: Self-pay | Admitting: Neurological Surgery

## 2022-04-28 DIAGNOSIS — R292 Abnormal reflex: Secondary | ICD-10-CM

## 2022-04-30 ENCOUNTER — Other Ambulatory Visit (HOSPITAL_COMMUNITY): Payer: Medicare Other

## 2022-05-10 ENCOUNTER — Ambulatory Visit (HOSPITAL_COMMUNITY)
Admission: RE | Admit: 2022-05-10 | Discharge: 2022-05-10 | Disposition: A | Discharge status: Home / Self Care | Payer: Medicare Other | Source: Ambulatory Visit | Attending: Neurological Surgery | Admitting: Neurological Surgery

## 2022-05-10 ENCOUNTER — Ambulatory Visit (HOSPITAL_COMMUNITY): Admission: RE | Admit: 2022-05-10 | Payer: Medicare Other | Source: Ambulatory Visit

## 2022-05-10 DIAGNOSIS — R292 Abnormal reflex: Secondary | ICD-10-CM | POA: Diagnosis not present

## 2022-05-14 ENCOUNTER — Other Ambulatory Visit: Payer: Self-pay | Admitting: Neurological Surgery

## 2022-06-04 NOTE — Pre-Procedure Instructions (Signed)
    Curtis Hansen  06/04/2022     No Pharmacies Listed   Your procedure is scheduled on Monday, January 8th, 2024.  Report to [facility] Admitting at 7:30 A.M.            [place]: [place] [address] [place], [place] [address]  Call this number if you have problems the morning of surgery:  [phone]  If you experience any cold or flu symptoms such as cough, fever, chills, shortness of breath, etc. between now and your scheduled surgery, please notify us at the above number.   Remember:  Do not eat or drink after midnight.      Take these medicines the morning of surgery with A SIP OF WATER : Metoprolol Succinate (Toprol-XL), Acetaminophen (PRN), Ativan (PRN), Oxycodone (PRN), Lyrica, Tamsulosin (Flomax)  Follow your surgeon's instructions on when to stop Aspirin.  If no instructions were given by your surgeon then you will need to call the office to get those instructions.   Per surgeon, Aspirin and Plavix to be stopped 1 week prior to surgery.  7 days prior to surgery STOP taking any Aspirin (unless otherwise instructed by your surgeon), Aleve, Naproxen, Ibuprofen, Motrin, Advil, Goody's, BC's, all herbal medications, fish oil, and all vitamins.      Do not wear jewelry, make-up or nail polish.  Do not wear lotions, powders, or perfumes, or deodorant.  Do not shave 48 hours prior to surgery.  Men may shave face and neck.  Do not bring valuables to the hospital.  [facility] is not responsible for any belongings or valuables.  Contacts, dentures or bridgework may not be worn into surgery.  Leave your suitcase in the car.  After surgery it may be brought to your room.  For patients admitted to the hospital, discharge time will be determined by your treatment team.  Patients discharged the day of surgery will not be allowed to drive home.

## 2022-06-04 NOTE — Progress Notes (Signed)
Per Abran Richard, at [facility], patient has refused his Aspirin and Plavix since January 1 (last does was 05/30/22).  Lattie Haw states that blood sugars are no longer checked, patient is no longer on Humalog or metformin.    Requested most recent EKG.    Instructions reviewed with Lattie Haw and faxed.  Lattie Haw verbalized understanding.

## 2022-06-07 ENCOUNTER — Other Ambulatory Visit: Payer: Self-pay

## 2022-06-07 ENCOUNTER — Inpatient Hospital Stay (HOSPITAL_COMMUNITY)
Admission: RE | Admit: 2022-06-07 | Discharge: 2022-06-10 | DRG: 472 | Disposition: A | Discharge status: Skilled Nursing Facility | Payer: Medicare Other | Attending: Neurological Surgery | Admitting: Neurological Surgery

## 2022-06-07 ENCOUNTER — Encounter (HOSPITAL_COMMUNITY): Payer: Self-pay | Admitting: Neurological Surgery

## 2022-06-07 ENCOUNTER — Encounter (HOSPITAL_COMMUNITY)
Admission: RE | Disposition: A | Discharge status: Skilled Nursing Facility | Payer: Self-pay | Source: Home / Self Care | Attending: Neurological Surgery

## 2022-06-07 ENCOUNTER — Inpatient Hospital Stay (HOSPITAL_COMMUNITY): Payer: Medicare Other | Admitting: Certified Registered Nurse Anesthetist

## 2022-06-07 ENCOUNTER — Inpatient Hospital Stay (HOSPITAL_COMMUNITY): Payer: Medicare Other

## 2022-06-07 DIAGNOSIS — M4712 Other spondylosis with myelopathy, cervical region: Secondary | ICD-10-CM | POA: Diagnosis present

## 2022-06-07 DIAGNOSIS — E119 Type 2 diabetes mellitus without complications: Secondary | ICD-10-CM | POA: Diagnosis present

## 2022-06-07 DIAGNOSIS — F419 Anxiety disorder, unspecified: Secondary | ICD-10-CM | POA: Diagnosis present

## 2022-06-07 DIAGNOSIS — Z7902 Long term (current) use of antithrombotics/antiplatelets: Secondary | ICD-10-CM | POA: Diagnosis not present

## 2022-06-07 DIAGNOSIS — Z888 Allergy status to other drugs, medicaments and biological substances status: Secondary | ICD-10-CM | POA: Diagnosis not present

## 2022-06-07 DIAGNOSIS — Z886 Allergy status to analgesic agent status: Secondary | ICD-10-CM | POA: Diagnosis not present

## 2022-06-07 DIAGNOSIS — Z7982 Long term (current) use of aspirin: Secondary | ICD-10-CM | POA: Diagnosis not present

## 2022-06-07 DIAGNOSIS — Z91048 Other nonmedicinal substance allergy status: Secondary | ICD-10-CM

## 2022-06-07 DIAGNOSIS — I739 Peripheral vascular disease, unspecified: Secondary | ICD-10-CM | POA: Diagnosis not present

## 2022-06-07 DIAGNOSIS — Z981 Arthrodesis status: Principal | ICD-10-CM

## 2022-06-07 DIAGNOSIS — Z7984 Long term (current) use of oral hypoglycemic drugs: Secondary | ICD-10-CM | POA: Diagnosis not present

## 2022-06-07 DIAGNOSIS — Z79899 Other long term (current) drug therapy: Secondary | ICD-10-CM | POA: Diagnosis not present

## 2022-06-07 DIAGNOSIS — E785 Hyperlipidemia, unspecified: Secondary | ICD-10-CM | POA: Diagnosis present

## 2022-06-07 DIAGNOSIS — M4802 Spinal stenosis, cervical region: Principal | ICD-10-CM | POA: Diagnosis present

## 2022-06-07 DIAGNOSIS — G4733 Obstructive sleep apnea (adult) (pediatric): Secondary | ICD-10-CM | POA: Diagnosis present

## 2022-06-07 DIAGNOSIS — G4730 Sleep apnea, unspecified: Secondary | ICD-10-CM

## 2022-06-07 DIAGNOSIS — Z794 Long term (current) use of insulin: Secondary | ICD-10-CM | POA: Diagnosis not present

## 2022-06-07 DIAGNOSIS — I10 Essential (primary) hypertension: Secondary | ICD-10-CM | POA: Diagnosis present

## 2022-06-07 HISTORY — PX: POSTERIOR CERVICAL FUSION/FORAMINOTOMY: SHX5038

## 2022-06-07 LAB — BASIC METABOLIC PANEL
Anion gap: 9 (ref 5–15)
BUN: 13 mg/dL (ref 8–23)
CO2: 23 mmol/L (ref 22–32)
Calcium: 8.9 mg/dL (ref 8.9–10.3)
Chloride: 106 mmol/L (ref 98–111)
Creatinine, Ser: 0.74 mg/dL (ref 0.61–1.24)
GFR, Estimated: >60 mL/min (ref >60)
Glucose, Bld: 95 mg/dL (ref 70–99)
Potassium: 4 mmol/L (ref 3.5–5.1)
Sodium: 138 mmol/L (ref 135–145)

## 2022-06-07 LAB — CBC
HCT: 50 % (ref 39.0–52.0)
Hemoglobin: 16.2 g/dL (ref 13.0–17.0)
MCH: 28.9 pg (ref 26.0–34.0)
MCHC: 32.4 g/dL (ref 30.0–36.0)
MCV: 89.3 fL (ref 80.0–100.0)
Platelets: 228 10*3/uL (ref 150–400)
RBC: 5.6 MIL/uL (ref 4.22–5.81)
RDW: 14.5 % (ref 11.5–15.5)
WBC: 9.2 10*3/uL (ref 4.0–10.5)
nRBC: 0 % (ref 0.0–0.2)

## 2022-06-07 LAB — TYPE AND SCREEN
ABO/RH(D): O POS
Antibody Screen: NEGATIVE

## 2022-06-07 LAB — PROTIME-INR
INR: 1.1 (ref 0.8–1.2)
Prothrombin Time: 13.9 seconds (ref 11.4–15.2)

## 2022-06-07 LAB — ABO/RH: ABO/RH(D): O POS

## 2022-06-07 LAB — GLUCOSE, CAPILLARY
Glucose-Capillary: 113 mg/dL — ABNORMAL HIGH (ref 70–99)
Glucose-Capillary: 138 mg/dL — ABNORMAL HIGH (ref 70–99)
Glucose-Capillary: 168 mg/dL — ABNORMAL HIGH (ref 70–99)

## 2022-06-07 LAB — SURGICAL PCR SCREEN
MRSA, PCR: NEGATIVE
Staphylococcus aureus: NEGATIVE

## 2022-06-07 LAB — HEMOGLOBIN A1C
Hgb A1c MFr Bld: 5.9 % — ABNORMAL HIGH (ref 4.8–5.6)
Mean Plasma Glucose: 122.63 mg/dL

## 2022-06-07 SURGERY — POSTERIOR CERVICAL FUSION/FORAMINOTOMY LEVEL 3
Anesthesia: General

## 2022-06-07 MED ORDER — LORAZEPAM 1 MG PO TABS: 1.0000 mg | ORAL_TABLET | Freq: Three times a day (TID) | ORAL | Status: DC | PRN

## 2022-06-07 MED ORDER — PROPOFOL 500 MG/50ML IV EMUL
INTRAVENOUS | Status: DC | PRN
  Administered 2022-06-07: 20 ug/kg/min via INTRAVENOUS

## 2022-06-07 MED ORDER — ONDANSETRON HCL 4 MG PO TABS
4.0000 mg | ORAL_TABLET | Freq: Four times a day (QID) | ORAL | Status: DC | PRN
  Administered 2022-06-07: 4 mg via ORAL
  Filled 2022-06-07: qty 1

## 2022-06-07 MED ORDER — BUPIVACAINE HCL (PF) 0.25 % IJ SOLN
INTRAMUSCULAR | Status: AC
  Filled 2022-06-07: qty 30

## 2022-06-07 MED ORDER — MENTHOL 3 MG MT LOZG: 1.0000 | LOZENGE | OROMUCOSAL | Status: DC | PRN

## 2022-06-07 MED ORDER — THROMBIN 5000 UNITS EX SOLR
CUTANEOUS | Status: AC
  Filled 2022-06-07: qty 5000

## 2022-06-07 MED ORDER — POLYETHYLENE GLYCOL 3350 17 G PO PACK
17.0000 g | PACK | Freq: Two times a day (BID) | ORAL | Status: DC
  Administered 2022-06-09: 17 g via ORAL
  Filled 2022-06-07 (×6): qty 1

## 2022-06-07 MED ORDER — METHOCARBAMOL 500 MG PO TABS
500.0000 mg | ORAL_TABLET | Freq: Four times a day (QID) | ORAL | Status: DC | PRN
  Administered 2022-06-08 – 2022-06-09 (×3): 500 mg via ORAL
  Filled 2022-06-07 (×4): qty 1

## 2022-06-07 MED ORDER — CEFAZOLIN SODIUM-DEXTROSE 2-4 GM/100ML-% IV SOLN
2.0000 g | Freq: Three times a day (TID) | INTRAVENOUS | Status: AC
  Administered 2022-06-07 – 2022-06-08 (×2): 2 g via INTRAVENOUS
  Filled 2022-06-07 (×2): qty 100

## 2022-06-07 MED ORDER — THROMBIN 20000 UNITS EX SOLR
CUTANEOUS | Status: DC | PRN
  Administered 2022-06-07: 20 mL

## 2022-06-07 MED ORDER — SUCCINYLCHOLINE CHLORIDE 200 MG/10ML IV SOSY
PREFILLED_SYRINGE | INTRAVENOUS | Status: AC
  Filled 2022-06-07: qty 10

## 2022-06-07 MED ORDER — CHLORHEXIDINE GLUCONATE CLOTH 2 % EX PADS: 6.0000 | MEDICATED_PAD | Freq: Once | CUTANEOUS | Status: DC

## 2022-06-07 MED ORDER — VANCOMYCIN HCL 1000 MG IV SOLR
INTRAVENOUS | Status: AC
  Filled 2022-06-07: qty 20

## 2022-06-07 MED ORDER — SODIUM CHLORIDE 0.9% FLUSH
3.0000 mL | Freq: Two times a day (BID) | INTRAVENOUS | Status: DC
  Administered 2022-06-08 – 2022-06-10 (×4): 3 mL via INTRAVENOUS

## 2022-06-07 MED ORDER — OXYCODONE HCL 5 MG PO TABS
10.0000 mg | ORAL_TABLET | ORAL | Status: DC | PRN
  Administered 2022-06-07 – 2022-06-09 (×5): 10 mg via ORAL
  Filled 2022-06-07 (×6): qty 2

## 2022-06-07 MED ORDER — PROPOFOL 10 MG/ML IV BOLUS
INTRAVENOUS | Status: AC
  Filled 2022-06-07: qty 20

## 2022-06-07 MED ORDER — ZINC SULFATE 220 (50 ZN) MG PO CAPS
220.0000 mg | ORAL_CAPSULE | Freq: Every day | ORAL | Status: DC
Start: 2022-06-07 — End: 2022-06-10
  Administered 2022-06-07 – 2022-06-10 (×4): 220 mg via ORAL
  Filled 2022-06-07 (×4): qty 1

## 2022-06-07 MED ORDER — SENNA 8.6 MG PO TABS: 2.0000 | ORAL_TABLET | Freq: Two times a day (BID) | ORAL | Status: DC

## 2022-06-07 MED ORDER — MORPHINE SULFATE (PF) 2 MG/ML IV SOLN: 2.0000 mg | INTRAVENOUS | Status: DC | PRN

## 2022-06-07 MED ORDER — METHOCARBAMOL 1000 MG/10ML IJ SOLN: 500.0000 mg | Freq: Four times a day (QID) | INTRAVENOUS | Status: DC | PRN

## 2022-06-07 MED ORDER — ORAL CARE MOUTH RINSE: 15.0000 mL | Freq: Once | OROMUCOSAL | Status: AC

## 2022-06-07 MED ORDER — SODIUM CHLORIDE 0.9% FLUSH: 3.0000 mL | INTRAVENOUS | Status: DC | PRN

## 2022-06-07 MED ORDER — VANCOMYCIN HCL 1000 MG IV SOLR
INTRAVENOUS | Status: DC | PRN
  Administered 2022-06-07: 1000 mg

## 2022-06-07 MED ORDER — SUGAMMADEX SODIUM 200 MG/2ML IV SOLN
INTRAVENOUS | Status: DC | PRN
  Administered 2022-06-07: 200 mg via INTRAVENOUS

## 2022-06-07 MED ORDER — PROPOFOL 10 MG/ML IV BOLUS
INTRAVENOUS | Status: DC | PRN
  Administered 2022-06-07: 150 mg via INTRAVENOUS
  Administered 2022-06-07: 50 mg via INTRAVENOUS

## 2022-06-07 MED ORDER — OXYCODONE HCL 5 MG PO TABS
ORAL_TABLET | ORAL | Status: AC
  Filled 2022-06-07: qty 2

## 2022-06-07 MED ORDER — INSULIN ASPART 100 UNIT/ML IJ SOLN
0.0000 [IU] | Freq: Three times a day (TID) | INTRAMUSCULAR | Status: DC
  Administered 2022-06-07 – 2022-06-08 (×2): 3 [IU] via SUBCUTANEOUS
  Administered 2022-06-08 (×2): 2 [IU] via SUBCUTANEOUS
  Administered 2022-06-09: 3 [IU] via SUBCUTANEOUS
  Administered 2022-06-09 – 2022-06-10 (×2): 2 [IU] via SUBCUTANEOUS

## 2022-06-07 MED ORDER — FENTANYL CITRATE (PF) 250 MCG/5ML IJ SOLN
INTRAMUSCULAR | Status: AC
  Filled 2022-06-07: qty 5

## 2022-06-07 MED ORDER — FENOFIBRATE 160 MG PO TABS
160.0000 mg | ORAL_TABLET | Freq: Every day | ORAL | Status: DC
  Administered 2022-06-08 – 2022-06-10 (×3): 160 mg via ORAL
  Filled 2022-06-07 (×4): qty 1

## 2022-06-07 MED ORDER — SODIUM CHLORIDE 0.9 % IV SOLN: 250.0000 mL | INTRAVENOUS | Status: DC

## 2022-06-07 MED ORDER — PHENYLEPHRINE 80 MCG/ML (10ML) SYRINGE FOR IV PUSH (FOR BLOOD PRESSURE SUPPORT)
PREFILLED_SYRINGE | INTRAVENOUS | Status: DC | PRN
  Administered 2022-06-07 (×2): 160 ug via INTRAVENOUS

## 2022-06-07 MED ORDER — ONDANSETRON HCL 4 MG/2ML IJ SOLN
INTRAMUSCULAR | Status: DC | PRN
  Administered 2022-06-07: 4 mg via INTRAVENOUS

## 2022-06-07 MED ORDER — AMISULPRIDE (ANTIEMETIC) 5 MG/2ML IV SOLN: 10.0000 mg | Freq: Once | INTRAVENOUS | Status: DC | PRN

## 2022-06-07 MED ORDER — FENTANYL CITRATE (PF) 100 MCG/2ML IJ SOLN
INTRAMUSCULAR | Status: AC
  Filled 2022-06-07: qty 2

## 2022-06-07 MED ORDER — CEFAZOLIN SODIUM-DEXTROSE 2-4 GM/100ML-% IV SOLN
2.0000 g | INTRAVENOUS | Status: AC
  Administered 2022-06-07: 2 g via INTRAVENOUS
  Filled 2022-06-07: qty 100

## 2022-06-07 MED ORDER — ACETAMINOPHEN 500 MG PO TABS
1000.0000 mg | ORAL_TABLET | ORAL | Status: AC
  Administered 2022-06-07: 1000 mg via ORAL
  Filled 2022-06-07: qty 2

## 2022-06-07 MED ORDER — SODIUM CHLORIDE 1 G PO TABS
1.0000 g | ORAL_TABLET | Freq: Every day | ORAL | Status: DC
  Administered 2022-06-09: 1 g via ORAL
  Filled 2022-06-07 (×3): qty 1

## 2022-06-07 MED ORDER — SIMETHICONE 80 MG PO CHEW
80.0000 mg | CHEWABLE_TABLET | Freq: Three times a day (TID) | ORAL | Status: DC
  Administered 2022-06-07 – 2022-06-10 (×10): 80 mg via ORAL
  Filled 2022-06-07 (×12): qty 1

## 2022-06-07 MED ORDER — TAMSULOSIN HCL 0.4 MG PO CAPS
0.4000 mg | ORAL_CAPSULE | Freq: Every day | ORAL | Status: DC
  Administered 2022-06-07 – 2022-06-10 (×4): 0.4 mg via ORAL
  Filled 2022-06-07 (×4): qty 1

## 2022-06-07 MED ORDER — ROCURONIUM BROMIDE 10 MG/ML (PF) SYRINGE
PREFILLED_SYRINGE | INTRAVENOUS | Status: DC | PRN
  Administered 2022-06-07: 30 mg via INTRAVENOUS
  Administered 2022-06-07: 50 mg via INTRAVENOUS

## 2022-06-07 MED ORDER — FENTANYL CITRATE (PF) 100 MCG/2ML IJ SOLN
25.0000 ug | INTRAMUSCULAR | Status: DC | PRN
  Administered 2022-06-07 (×3): 50 ug via INTRAVENOUS

## 2022-06-07 MED ORDER — ROCURONIUM BROMIDE 10 MG/ML (PF) SYRINGE
PREFILLED_SYRINGE | INTRAVENOUS | Status: AC
  Filled 2022-06-07: qty 10

## 2022-06-07 MED ORDER — FENTANYL CITRATE (PF) 250 MCG/5ML IJ SOLN
INTRAMUSCULAR | Status: DC | PRN
  Administered 2022-06-07: 100 ug via INTRAVENOUS
  Administered 2022-06-07: 50 ug via INTRAVENOUS
  Administered 2022-06-07: 100 ug via INTRAVENOUS

## 2022-06-07 MED ORDER — SENNA 8.6 MG PO TABS
1.0000 | ORAL_TABLET | Freq: Two times a day (BID) | ORAL | Status: DC
  Administered 2022-06-09: 8.6 mg via ORAL
  Filled 2022-06-07 (×6): qty 1

## 2022-06-07 MED ORDER — GABAPENTIN 300 MG PO CAPS
300.0000 mg | ORAL_CAPSULE | ORAL | Status: AC
  Administered 2022-06-07: 300 mg via ORAL
  Filled 2022-06-07: qty 1

## 2022-06-07 MED ORDER — BUPIVACAINE HCL (PF) 0.25 % IJ SOLN
INTRAMUSCULAR | Status: DC | PRN
  Administered 2022-06-07: 10 mL

## 2022-06-07 MED ORDER — 0.9 % SODIUM CHLORIDE (POUR BTL) OPTIME
TOPICAL | Status: DC | PRN
  Administered 2022-06-07: 1000 mL

## 2022-06-07 MED ORDER — CHLORHEXIDINE GLUCONATE 0.12 % MT SOLN
15.0000 mL | Freq: Once | OROMUCOSAL | Status: AC
  Administered 2022-06-07: 15 mL via OROMUCOSAL
  Filled 2022-06-07: qty 15

## 2022-06-07 MED ORDER — POTASSIUM CHLORIDE IN NACL 20-0.9 MEQ/L-% IV SOLN
INTRAVENOUS | Status: DC
  Filled 2022-06-07: qty 1000

## 2022-06-07 MED ORDER — MIDAZOLAM HCL 2 MG/2ML IJ SOLN
INTRAMUSCULAR | Status: AC
  Filled 2022-06-07: qty 2

## 2022-06-07 MED ORDER — METOPROLOL SUCCINATE ER 25 MG PO TB24
12.5000 mg | ORAL_TABLET | Freq: Every day | ORAL | Status: DC
  Administered 2022-06-08 – 2022-06-10 (×3): 12.5 mg via ORAL
  Filled 2022-06-07 (×3): qty 1

## 2022-06-07 MED ORDER — MIDAZOLAM HCL 2 MG/2ML IJ SOLN
INTRAMUSCULAR | Status: DC | PRN
  Administered 2022-06-07: 2 mg via INTRAVENOUS

## 2022-06-07 MED ORDER — DEXAMETHASONE SODIUM PHOSPHATE 10 MG/ML IJ SOLN
INTRAMUSCULAR | Status: DC | PRN
  Administered 2022-06-07: 4 mg via INTRAVENOUS

## 2022-06-07 MED ORDER — THROMBIN 5000 UNITS EX SOLR
OROMUCOSAL | Status: DC | PRN
  Administered 2022-06-07: 5 mL via TOPICAL

## 2022-06-07 MED ORDER — LIDOCAINE 2% (20 MG/ML) 5 ML SYRINGE
INTRAMUSCULAR | Status: DC | PRN
  Administered 2022-06-07: 60 mg via INTRAVENOUS

## 2022-06-07 MED ORDER — DIPHENHYDRAMINE HCL 25 MG PO CAPS: 25.0000 mg | ORAL_CAPSULE | Freq: Every evening | ORAL | Status: DC | PRN

## 2022-06-07 MED ORDER — PREGABALIN 75 MG PO CAPS
150.0000 mg | ORAL_CAPSULE | Freq: Three times a day (TID) | ORAL | Status: DC
Start: 2022-06-07 — End: 2022-06-10
  Administered 2022-06-07 – 2022-06-10 (×8): 150 mg via ORAL
  Filled 2022-06-07 (×8): qty 2

## 2022-06-07 MED ORDER — PHENOL 1.4 % MT LIQD: 1.0000 | OROMUCOSAL | Status: DC | PRN

## 2022-06-07 MED ORDER — ONDANSETRON HCL 4 MG/2ML IJ SOLN: 4.0000 mg | Freq: Four times a day (QID) | INTRAMUSCULAR | Status: DC | PRN

## 2022-06-07 MED ORDER — LACTATED RINGERS IV SOLN: INTRAVENOUS | Status: DC

## 2022-06-07 MED ORDER — ONDANSETRON HCL 4 MG/2ML IJ SOLN
INTRAMUSCULAR | Status: AC
  Filled 2022-06-07: qty 2

## 2022-06-07 MED ORDER — ENSURE ENLIVE PO LIQD
1.0000 | Freq: Three times a day (TID) | ORAL | Status: DC
  Administered 2022-06-07 – 2022-06-10 (×6): 237 mL via ORAL

## 2022-06-07 MED ORDER — ACETAMINOPHEN 500 MG PO TABS
1000.0000 mg | ORAL_TABLET | Freq: Four times a day (QID) | ORAL | Status: AC
  Administered 2022-06-07 – 2022-06-08 (×4): 1000 mg via ORAL
  Filled 2022-06-07 (×4): qty 2

## 2022-06-07 MED ORDER — DEXAMETHASONE SODIUM PHOSPHATE 10 MG/ML IJ SOLN
INTRAMUSCULAR | Status: AC
  Filled 2022-06-07: qty 1

## 2022-06-07 MED ORDER — BACITRACIN ZINC 500 UNIT/GM EX OINT
TOPICAL_OINTMENT | CUTANEOUS | Status: AC
  Filled 2022-06-07: qty 28.35

## 2022-06-07 MED ORDER — THROMBIN 20000 UNITS EX SOLR
CUTANEOUS | Status: AC
  Filled 2022-06-07: qty 20000

## 2022-06-07 MED ORDER — PHENYLEPHRINE HCL-NACL 20-0.9 MG/250ML-% IV SOLN
INTRAVENOUS | Status: DC | PRN
  Administered 2022-06-07: 10 ug/min via INTRAVENOUS

## 2022-06-07 MED ORDER — BISACODYL 10 MG RE SUPP: 10.0000 mg | Freq: Every day | RECTAL | Status: DC | PRN

## 2022-06-07 MED ORDER — LIDOCAINE 2% (20 MG/ML) 5 ML SYRINGE
INTRAMUSCULAR | Status: AC
  Filled 2022-06-07: qty 5

## 2022-06-07 SURGICAL SUPPLY — 54 items
BAG COUNTER SPONGE SURGICOUNT (BAG) ×1 IMPLANT
BAND RUBBER #18 3X1/16 STRL (MISCELLANEOUS) IMPLANT
BASKET BONE COLLECTION (BASKET) IMPLANT
BENZOIN TINCTURE PRP APPL 2/3 (GAUZE/BANDAGES/DRESSINGS) ×2 IMPLANT
BIT DRILL INVICTUS SUB 2.1 STR (BIT) IMPLANT
BLADE CLIPPER SURG (BLADE) IMPLANT
BUR CARBIDE MATCH 3.0 (BURR) IMPLANT
CANISTER SUCT 3000ML PPV (MISCELLANEOUS) ×1 IMPLANT
DERMABOND ADVANCED .7 DNX12 (GAUZE/BANDAGES/DRESSINGS) IMPLANT
DRAPE C-ARM 42X72 X-RAY (DRAPES) ×2 IMPLANT
DRAPE LAPAROTOMY 100X72 PEDS (DRAPES) ×1 IMPLANT
DRAPE MICROSCOPE SLANT 54X150 (MISCELLANEOUS) IMPLANT
DRSG OPSITE POSTOP 3X4 (GAUZE/BANDAGES/DRESSINGS) IMPLANT
DRSG OPSITE POSTOP 4X6 (GAUZE/BANDAGES/DRESSINGS) IMPLANT
DURAPREP 26ML APPLICATOR (WOUND CARE) ×1 IMPLANT
ELECT REM PT RETURN 9FT ADLT (ELECTROSURGICAL) ×1
ELECTRODE REM PT RTRN 9FT ADLT (ELECTROSURGICAL) ×1 IMPLANT
EVACUATOR 1/8 PVC DRAIN (DRAIN) IMPLANT
FIBER BONE ALLOSYNC EXPAND 5 (Bone Implant) IMPLANT
GAUZE 4X4 16PLY ~~LOC~~+RFID DBL (SPONGE) IMPLANT
GAUZE SPONGE 4X4 12PLY STRL (GAUZE/BANDAGES/DRESSINGS) ×1 IMPLANT
GLOVE BIO SURGEON STRL SZ7 (GLOVE) IMPLANT
GLOVE BIO SURGEON STRL SZ8 (GLOVE) ×1 IMPLANT
GLOVE BIOGEL PI IND STRL 7.0 (GLOVE) IMPLANT
GLOVE BIOGEL PI IND STRL 7.5 (GLOVE) IMPLANT
GLOVE SURG SS PI 6.5 STRL IVOR (GLOVE) IMPLANT
GOWN STRL REUS W/ TWL LRG LVL3 (GOWN DISPOSABLE) IMPLANT
GOWN STRL REUS W/ TWL XL LVL3 (GOWN DISPOSABLE) ×1 IMPLANT
GOWN STRL REUS W/TWL 2XL LVL3 (GOWN DISPOSABLE) IMPLANT
GOWN STRL REUS W/TWL LRG LVL3 (GOWN DISPOSABLE) ×1
GOWN STRL REUS W/TWL XL LVL3 (GOWN DISPOSABLE) ×2
HEMOSTAT POWDER KIT SURGIFOAM (HEMOSTASIS) IMPLANT
KIT BASIN OR (CUSTOM PROCEDURE TRAY) ×1 IMPLANT
KIT TURNOVER KIT B (KITS) ×1 IMPLANT
NDL SPNL 20GX3.5 QUINCKE YW (NEEDLE) IMPLANT
NEEDLE HYPO 22GX1.5 SAFETY (NEEDLE) ×1 IMPLANT
NEEDLE SPNL 20GX3.5 QUINCKE YW (NEEDLE) IMPLANT
NS IRRIG 1000ML POUR BTL (IV SOLUTION) ×1 IMPLANT
PACK LAMINECTOMY NEURO (CUSTOM PROCEDURE TRAY) ×1 IMPLANT
PAD ARMBOARD 7.5X6 YLW CONV (MISCELLANEOUS) ×1 IMPLANT
PIN MAYFIELD SKULL DISP (PIN) ×1 IMPLANT
ROD LORD TI 3.5X55 (Rod) IMPLANT
SCREW POLYAXIAL 3.5 X 14 (Screw) IMPLANT
SCREW SET ATEC (Screw) IMPLANT
SPONGE SURGIFOAM ABS GEL 100 (HEMOSTASIS) ×1 IMPLANT
SPONGE T-LAP 4X18 ~~LOC~~+RFID (SPONGE) IMPLANT
STRIP CLOSURE SKIN 1/2X4 (GAUZE/BANDAGES/DRESSINGS) ×1 IMPLANT
SUT VIC AB 0 CT1 18XCR BRD8 (SUTURE) ×1 IMPLANT
SUT VIC AB 0 CT1 8-18 (SUTURE) ×2
SUT VIC AB 2-0 CP2 18 (SUTURE) ×1 IMPLANT
SUT VIC AB 3-0 SH 8-18 (SUTURE) ×1 IMPLANT
TOWEL GREEN STERILE (TOWEL DISPOSABLE) ×1 IMPLANT
TOWEL GREEN STERILE FF (TOWEL DISPOSABLE) ×1 IMPLANT
WATER STERILE IRR 1000ML POUR (IV SOLUTION) ×1 IMPLANT

## 2022-06-07 NOTE — Anesthesia Postprocedure Evaluation (Signed)
Anesthesia Post Note  Patient: Curtis Hansen  Procedure(s) Performed: Posterior cervical fusion with lateral mass fixation - Cervical three - Cervical six, laminectomy  Cervical three-five     Patient location during evaluation: PACU Anesthesia Type: General Level of consciousness: awake and alert Pain management: pain level controlled Vital Signs Assessment: post-procedure vital signs reviewed and stable Respiratory status: spontaneous breathing, nonlabored ventilation, respiratory function stable and patient connected to nasal cannula oxygen Cardiovascular status: blood pressure returned to baseline and stable Postop Assessment: no apparent nausea or vomiting Anesthetic complications: no  No notable events documented.  Last Vitals:  Vitals:   06/07/22 1430 06/07/22 1507  BP: (!) 126/58 (!) 159/94  Pulse: 76 72  Resp: 12 16  Temp: 36.6 C (!) 36.4 C  SpO2: 96% 100%    Last Pain:  Vitals:   06/07/22 1507  TempSrc: Oral  PainSc:                  Tiajuana Amass

## 2022-06-07 NOTE — H&P (Signed)
Subjective:   Patient is a 67 y.o. male admitted for cervical myelopathy. The patient first presented to me with complaints of numbness of the arm(s) and change in gait. Onset of symptoms was several months ago. The pain is described as aching and occurs intermittently. The pain is rated moderate, and is located in the neck and radiates to the shoulders. He has not walked for over 6 months. Has be[facility] in [facility].  The symptoms have been progressive. Symptoms are exacerbated by extending head backwards, and are relieved by none.  Previous work up includes MRI of cervical spine, results: spinal stenosis.  Past Medical History:  Diagnosis Date   Abnormality of gait    Anxiety    Cognitive communication deficit    Diabetes ([place])    Gangrene ([place])    Hyperlipemia    Hypertension    Multiple fractures    right ribs   OSA (obstructive sleep apnea)    Repeated falls    Spinal stenosis     Past Surgical History:  Procedure Laterality Date   COLONOSCOPY WITH PROPOFOL N/A 04/06/2022   Procedure: COLONOSCOPY WITH PROPOFOL;  Surgeon: Harvel Quale, MD;  [facility];  Service: Gastroenterology;  Laterality: N/A;  10:00 asa 2 PATIENT IS IN [facility]   HEMOSTASIS CLIP PLACEMENT  04/06/2022   Procedure: HEMOSTASIS CLIP PLACEMENT;  Surgeon: Harvel Quale, MD;  Location: [facility];  Service: Gastroenterology;;   POLYPECTOMY  04/06/2022   Procedure: POLYPECTOMY;  Surgeon: Harvel Quale, MD;  Location: [facility];  Service: Gastroenterology;;    Allergies  Allergen Reactions   Rosuvastatin Nausea Only   Grass Pollen(K-O-R-T-Swt Vern) Cough   Celecoxib Other (See Comments)    Social History   Tobacco Use   Smoking status: Never    Passive exposure: Never   Smokeless tobacco: Never  Substance Use Topics   Alcohol use: Never    History reviewed. No pertinent family history. Prior to Admission  medications   Medication Sig Start Date End Date Taking? Authorizing Provider  aspirin EC 81 MG tablet Take 81 mg by mouth daily.   Yes [provider]  clopidogrel (PLAVIX) 75 MG tablet Take 75 mg by mouth daily.   Yes [provider]  melatonin 3 MG TABS tablet Take 3 mg by mouth at bedtime as needed (Sleep). q24 11/28/21  Yes [provider]  metoprolol succinate (TOPROL-XL) 25 MG 24 hr tablet Take 12.5 mg by mouth daily. 08/25/21  Yes [provider]  pregabalin (LYRICA) 150 MG capsule Take 150 mg by mouth 3 (three) times daily. 02/05/22 06/04/22 Yes [provider]  acetaminophen (TYLENOL) 325 MG tablet Take 650 mg by mouth every 6 (six) hours as needed for mild pain. Do not exceed 3000 mg in 24 hrs    [provider]  ATIVAN 1 MG tablet Take 1 mg by mouth every 8 (eight) hours as needed for anxiety. 04/29/22   [provider]  atorvastatin (LIPITOR) 40 MG tablet Take 40 mg by mouth at bedtime. 01/13/21   [provider]  bisacodyl (DULCOLAX) 10 MG suppository Place 10 mg rectally daily as needed for moderate constipation, mild constipation or severe constipation.    [provider]  diphenhydrAMINE (BENADRYL) 25 mg capsule Take 25 mg by mouth at bedtime as needed for itching. 11/28/21   [provider]  fenofibrate 160 MG tablet Take 160 mg by mouth daily. 10/31/13  [provider]  insulin lispro (HUMALOG) 100 UNIT/ML injection Inject into the skin 3 (three) times daily before meals. Sliding scale    [provider]  lidocaine (LIDODERM) 5 % Place 1 patch onto the skin daily. Remove & Discard patch within 12 hours or as directed by MD @ 1800 Back pain    [provider]  Nutritional Supplements (ENSURE PO) Take 120 mLs by mouth 2 (two) times daily. -8 ounces    [provider]  OxyCODONE HCl, Abuse Deter, (OXAYDO) 5 MG TABA Take 5 mg by mouth every 6 (six) hours as needed (Pain).     [provider]  polyethylene glycol (MIRALAX / GLYCOLAX) 17 g packet Take 17 g by mouth 2 (two) times daily. 11/28/21   [provider]  senna (SENOKOT) 8.6 MG TABS tablet Take 2 tablets by mouth 2 (two) times daily.    [provider]  simethicone (MYLICON) 80 MG chewable tablet Chew 80 mg by mouth 4 (four) times daily -  with meals and at bedtime.    [provider]  sodium chloride 1 g tablet Take 1 g by mouth daily.    [provider]  tamsulosin (FLOMAX) 0.4 MG CAPS capsule Take 0.4 mg by mouth daily. 05/18/21   [provider]  traZODone (DESYREL) 50 MG tablet Take 50 mg by mouth at bedtime.    [provider]  zinc gluconate 50 MG tablet Take 50 mg by mouth daily.    [provider]     Review of Systems  Positive ROS: neg  All other systems have been reviewed and were otherwise negative with the exception of those mentioned in the HPI and as above.  Objective: Vital signs in last 24 hours: Temp:  [98 F (36.7 C)] 98 F (36.7 C) (01/08 0802) Pulse Rate:  [76] 76 (01/08 0802) Resp:  [18] 18 (01/08 0802) BP: (135)/(76) 135/76 (01/08 0802) SpO2:  [98 %] 98 % (01/08 0802) Weight:  [91.6 kg] 91.6 kg (01/08 0802)  General Appearance: Alert, cooperative, no distress, appears stated age Head: Normocephalic, without obvious abnormality, atraumatic Eyes: PERRL, conjunctiva/corneas clear, EOM's intact      Neck: Supple, symmetrical, trachea midline, Back: Symmetric, no curvature, ROM normal, no CVA tenderness Lungs:  respirations unlabored Heart: Regular rate and rhythm Abdomen: Soft, non-tender Extremities: Extremities normal, atraumatic, no cyanosis or edema Pulses: 2+ and symmetric all extremities Skin: Skin color, texture, turgor normal, no rashes or lesions  NEUROLOGIC:  Mental status: Alert and oriented x4, no aphasia, good attention span, fund of knowledge and memory  Motor Exam - fairly strong but  ataxic and atrophy in hands  Sensory Exam - decreased in arms Reflexes: 3+ Coordination - grossly normal Gait - unable to test Balance - unable to test Cranial Nerves: I: smell Not tested  II: visual acuity  OS: nl    OD: nl  II: visual fields Full to confrontation  II: pupils Equal, round, reactive to light  III,VII: ptosis None  III,IV,VI: extraocular muscles  Full ROM  V: mastication Normal  V: facial light touch sensation  Normal  V,VII: corneal reflex  Present  VII: facial muscle function - upper  Normal  VII: facial muscle function - lower Normal  VIII: hearing Not tested  IX: soft palate elevation  Normal  IX,X: gag reflex Present  XI: trapezius strength  5/5  XI: sternocleidomastoid strength 5/5  XI: neck flexion strength  5/5  XII: tongue strength  Normal    Data Review Lab Results  Component Value Date   WBC 9.2 06/07/2022   HGB 16.2 06/07/2022   HCT 50.0 06/07/2022   MCV 89.3 06/07/2022   PLT 228 06/07/2022   Lab Results  Component Value Date   NA 138 06/07/2022   K 4.0 06/07/2022   CL 106 06/07/2022   CO2 23 06/07/2022   BUN 13 06/07/2022   CREATININE 0.74 06/07/2022   GLUCOSE 95 06/07/2022   No results found for: "INR", "PROTIME"  Assessment:   Cervical neck pain with 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 Estimated body mass index is 29.83 kg/m as calculated from the following:   Height as of this encounter: '5\' 9"'$  (1.753 m).   Weight as of this encounter: 91.6 kg.  Patient has failed conservative therapy. Planned surgery : CL C3-5 or 6, PCF C3-6 or 7.  Plan:   I explained the condition and procedure to the patient and answered any questions.  Patient wishes to proceed with procedure as planned. Understands risks/ benefits/ and expected or typical outcomes.  Eustace Moore 06/07/2022 9:48 AM

## 2022-06-07 NOTE — Op Note (Signed)
06/07/2022  12:58 PM  PATIENT:  Curtis Hansen Age  67 y.o. 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  Cervical laminectomy, medial facetectomy foraminotomies C3-4 C4-5 C5-6, 2.  Segmental fixation C3-C6 inclusive utilizing Alphatec lateral mass screws, 3.  Posterolateral arthrodesis C3-C6 inclusive utilizing locally harvested morselized autologous bone graft and morselized allograft  SURGEON:  Sherley Bounds, MD  ASSISTANTS: Glenford Peers, FNP  ANESTHESIA:   General  EBL: 150 ml  Total I/O In: 1000 [I.V.:1000] Out: 150 [Blood:150]  BLOOD ADMINISTERED: none  DRAINS: Medium Hemovac  SPECIMEN:  none  INDICATION FOR PROCEDURE: This patient presented with inability to walk and numbness and weakness in his hands and hyperreflexia. Imaging showed severe cervical spinal stenosis with signal change in the spinal cord with severe spondylosis. The patient tried conservative measures without relief. Pain was debilitating. Recommended decompressive laminectomy followed by instrumented fusion. Patient understood the risks, benefits, and alternatives and potential outcomes and wished to proceed.  PROCEDURE DETAILS: The patient was brought to the operating room. Generalized endotracheal anesthesia was induced. The patient was affixed a 3 point [place] headrest and rolled into the prone position on chest rolls. All pressure points were padded. The posterior cervical region was cleaned and prepped with DuraPrep and then draped in the usual sterile fashion. 7 cc of local anesthesia was injected and a dorsal midline incision made in the posterior cervical region and carried down to the cervical fascia. The fascia was opened and the paraspinous musculature was taken down to expose C3 to C6. Intraoperative fluoroscopy confirmed my level and then the dissection was carried out over the  lateral facets. I localized the midpoint of each lateral mass and marked a region 1 mm medial to the midpoint of the lateral mass, and then drilled in an upward and outward direction into the safe zone of each lateral mass. I drilled to a depth of 14 mm and then checked my drill hole with a ball probe. I then placed a 14 mm lateral mass screws into the safe zone of each lateral mass C3-C6 inclusive bilaterally until they were 2 fingers tight. I then gently decompressed the central canal with the 1 and 2 mm Kerrison punch from C3 to C6. Medial facetectomies were performed, and foraminotomies were performed at C4-5. Once the decompression was complete the dura was full and capacious and I could see the spinal cord pulsatile through the dura. I then decorticated the lateral masses and the facet joints and packed them with local autograft and morcellized allograft to perform arthrodesis from C3-C6 inclusive. I then placed rods into the multiaxial screw heads of the screws and locked these into position with the locking caps and anti-torque device. I then checked the final construct with AP/Lat fluoroscopy. I irrigated with saline solution containing bacitracin. I placed a medium Hemovac drain through separate stab incision, and lined the dura with Gelfoam. After hemostasis was achieved I closed the muscle and the fascia with 0 Vicryl, subcutaneous tissue with 2-0 Vicryl, and the subcuticular tissue with 3-0 Vicryl. The skin was closed with benzoin and Steri-Strips. A sterile dressing was applied, the patient was turned to the supine position and taken out of the headrest, awakened from general anesthesia and transferred to the recovery room in stable condition. At the end of the procedure all sponge, needle and instrument counts were correct.    PLAN OF CARE: Admit to inpatient  PATIENT DISPOSITION:  PACU - hemodynamically stable.   Delay start of Pharmacological VTE agent (>24hrs) due to surgical blood loss or  risk of bleeding:  yes

## 2022-06-07 NOTE — Anesthesia Preprocedure Evaluation (Addendum)
Anesthesia Evaluation  Patient identified by MRN, date of birth, ID band Patient awake    Reviewed: Allergy & Precautions, NPO status , Patient's Chart, lab work & pertinent test results  Airway Mallampati: III  TM Distance: >3 FB Neck ROM: Limited    Dental  (+) Dental Advisory Given   Pulmonary sleep apnea    breath sounds clear to auscultation       Cardiovascular hypertension, Pt. on medications and Pt. on home beta blockers + Peripheral Vascular Disease (On plavix for chronic LE occlusive dz)   Rhythm:Regular Rate:Normal     Neuro/Psych negative neurological ROS     GI/Hepatic negative GI ROS, Neg liver ROS,,,  Endo/Other  diabetes, Type 2, Oral Hypoglycemic Agents    Renal/GU negative Renal ROS     Musculoskeletal   Abdominal   Peds  Hematology negative hematology ROS (+)   Anesthesia Other Findings   Reproductive/Obstetrics                             Anesthesia Physical Anesthesia Plan  ASA: 3  Anesthesia Plan: General   Post-op Pain Management: Tylenol PO (pre-op)*   Induction: Intravenous  PONV Risk Score and Plan: 2 and Dexamethasone, Ondansetron and Treatment may vary due to age or medical condition  Airway Management Planned: Oral ETT and Video Laryngoscope Planned  Additional Equipment:   Intra-op Plan:   Post-operative Plan: Extubation in OR  Informed Consent: I have reviewed the patients History and Physical, chart, labs and discussed the procedure including the risks, benefits and alternatives for the proposed anesthesia with the patient or authorized representative who has indicated his/her understanding and acceptance.     Dental advisory given  Plan Discussed with: CRNA  Anesthesia Plan Comments:        Anesthesia Quick Evaluation

## 2022-06-07 NOTE — Transfer of Care (Signed)
Immediate Anesthesia Transfer of Care Note  Patient: Curtis Hansen  Procedure(s) Performed: Posterior cervical fusion with lateral mass fixation - Cervical three - Cervical six, laminectomy  Cervical three-five  Patient Location: PACU  Anesthesia Type:General  Level of Consciousness: drowsy, patient cooperative, and responds to stimulation  Airway & Oxygen Therapy: Patient Spontanous Breathing  Post-op Assessment: Report given to RN and Post -op Vital signs reviewed and stable  Post vital signs: Reviewed and stable  Last Vitals:  Vitals Value Taken Time  BP 122/64 06/07/22 1300  Temp    Pulse 80 06/07/22 1300  Resp 24 06/07/22 1300  SpO2 94 % 06/07/22 1300  Vitals shown include unvalidated device data.  Last Pain:  Vitals:   06/07/22 0802  TempSrc: Oral      Patients Stated Pain Goal: 0 (31/54/00 8676)  Complications: No notable events documented.

## 2022-06-07 NOTE — Anesthesia Procedure Notes (Signed)
Procedure Name: Intubation Date/Time: 06/07/2022 10:20 AM  Performed by: Janace Litten, CRNAPre-anesthesia Checklist: Patient identified, Emergency Drugs available, Suction available and Patient being monitored Patient Re-evaluated:Patient Re-evaluated prior to induction Oxygen Delivery Method: Circle System Utilized Preoxygenation: Pre-oxygenation with 100% oxygen Induction Type: IV induction Ventilation: Mask ventilation without difficulty and Oral airway inserted - appropriate to patient size Laryngoscope Size: Glidescope and 4 Grade View: Grade I Tube type: Oral Tube size: 7.5 mm Number of attempts: 1 Airway Equipment and Method: Stylet, Video-laryngoscopy and Oral airway Placement Confirmation: ETT inserted through vocal cords under direct vision, positive ETCO2 and breath sounds checked- equal and bilateral Secured at: 23 cm Tube secured with: Tape Dental Injury: Teeth and Oropharynx as per pre-operative assessment  Comments: Elective glidescope d/t cervical neuropathies

## 2022-06-07 NOTE — Progress Notes (Signed)
Pt admitted from PACU posterior cervical fusion. Pt A&OX4. BP (!) 159/94 (BP Location: Right Arm)   Pulse 72   Temp (!) 97.5 F (36.4 C) (Oral)   Resp 16   Ht '5\' 9"'$  (1.753 m)   Wt 91.6 kg   SpO2 100%   BMI 29.83 kg/m  Not on tele. Nsw/20k started at 40. PRN pain meds given with relief. Pt is tolerating po meds w/o complications. No skin issues besides surgical site. Bed in lowest position, call bell near by and 2/4 rails up and wheels locked. Louanne Skye 06/07/22 6:42 PM

## 2022-06-08 ENCOUNTER — Encounter (HOSPITAL_COMMUNITY): Payer: Self-pay | Admitting: Neurological Surgery

## 2022-06-08 LAB — GLUCOSE, CAPILLARY
Glucose-Capillary: 210 mg/dL — ABNORMAL HIGH (ref 70–99)
Glucose-Capillary: 123 mg/dL — ABNORMAL HIGH (ref 70–99)
Glucose-Capillary: 138 mg/dL — ABNORMAL HIGH (ref 70–99)
Glucose-Capillary: 189 mg/dL — ABNORMAL HIGH (ref 70–99)

## 2022-06-08 NOTE — Evaluation (Signed)
Occupational Therapy Evaluation Patient Details Name: Curtis Hansen MRN: [id number] DOB: [birth year] Today's Date: 06/08/2022   History of Present Illness Patient is a 67 y.o. male admitted for cervical myelopathy. Has been experiencing numbness in weakness in all extremities. Has not walked in 6 months. Has been receiving OT and PT at [facility] and nursing. S/P cervical fusion 06/07/21.   Clinical Impression   Pt in bed upon therapy arrival. Reports lack of sleep through the night and increased level of pain. Pt was hesitant to participate in therapy evaluation although agreeable to modified assessment. Nursing was notified of request from pain meds at start of session. Prior to admit, pt was staying at [facility] in [place]. He reports that he has not walked for 6 months. A hoyer lift or SB was used for functional transfers.   He required Mod-Max A to complete bed level self care tasks. Currently, pt is experiencing increased pain, decreased strength, coordination, and activity tolerance requiring increased physical assistance to complete BADL tasks at bed level. Recommend pt return to SNF to continue with skilled OT services to focus on mentioned deficits and progress his overall functional performance. Acute OT will continue to follow patient.       Recommendations for follow up therapy are one component of a multi-disciplinary discharge planning process, led by the attending physician.  Recommendations may be updated based on patient status, additional functional criteria and insurance authorization.   Follow Up Recommendations  Skilled nursing-short term rehab (<3 hours/day)     Assistance Recommended at Discharge Intermittent Supervision/Assistance  Patient can return home with the following Two people to help with walking and/or transfers;Two people to help with bathing/dressing/bathroom;Assistance with feeding;Assist for transportation    Functional Status  Assessment  Patient has had a recent decline in their functional status and demonstrates the ability to make significant improvements in function in a reasonable and predictable amount of time.  Equipment Recommendations  None recommended by OT       Precautions / Restrictions Precautions Precautions: Cervical Precaution Comments: No functional bilateral shoulder movement due to baseline RTC deficits. Hx of two RTC surgeries with right shoulder. Restrictions Weight Bearing Restrictions: No      Mobility Bed Mobility Overal bed mobility: Needs Assistance Bed Mobility: Rolling Rolling: Max assist, +2 for physical assistance         General bed mobility comments: Pt required more assistance to roll to the left due to RTC issues that are more prominent on the right side. Utilized bed rails to assist Patient Response: Flat affect  Transfers Overall transfer level:  (NT this date. Not medically ready with reports of pain)            Balance Overall balance assessment:  (Unable to assess this session)       ADL either performed or assessed with clinical judgement   ADL Overall ADL's : Needs assistance/impaired Eating/Feeding: Minimal assistance;Sitting   Grooming: Minimal assistance;Bed level   Upper Body Bathing: Maximal assistance;Bed level   Lower Body Bathing: Bed level;Total assistance   Upper Body Dressing : Minimal assistance;Bed level   Lower Body Dressing: Total assistance;Bed level   Toilet Transfer:  (not assessed this date due to reported pain)   Toileting- Clothing Manipulation and Hygiene: Total assistance;+2 for physical assistance;Bed level         Vision Baseline Vision/History: 0 No visual deficits Ability to See in Adequate Light: 0 Adequate Patient Visual Report: No change from baseline Vision  Assessment?: No apparent visual deficits            Pertinent Vitals/Pain Pain Assessment Pain Assessment: 0-10 Pain Score: 8  Pain Location:  head Pain Descriptors / Indicators: Aching Pain Intervention(s): Patient requesting pain meds-RN notified, Limited activity within patient's tolerance, Monitored during session     Hand Dominance Left   Extremity/Trunk Assessment Upper Extremity Assessment Upper Extremity Assessment: RUE deficits/detail;LUE deficits/detail RUE Deficits / Details: Baseline RTC defcits. Unable to demonstrate active or passive ROM shoulder flexion, abduction. Elbow, wrist, hand ranges WFL A/ROM. MMT: 4+/5 elbow, wrist in all ranges. Decreased gross grasp. LUE Deficits / Details: Baseline RTC defcits. Unable to demonstrate active or passive ROM shoulder flexion, abduction. Elbow, wrist, hand ranges WFL A/ROM. MMT: 4+/5 elbow, wrist in all ranges. Decreased gross grasp.   Lower Extremity Assessment Lower Extremity Assessment: Defer to PT evaluation   Cervical / Trunk Assessment Cervical / Trunk Assessment: Neck Surgery   Communication Communication Communication: No difficulties   Cognition Arousal/Alertness: Awake/alert Behavior During Therapy: Flat affect Overall Cognitive Status: Within Functional Limits for tasks assessed                    Home Living Family/patient expects to be discharged to:: Dwight: Wheelchair - Publishing copy (2 wheels)   Additional Comments: Is active with OT and PT      Prior Functioning/Environment     Mobility Comments: Pt reports bed to w/c transfer completed with Harrel Lemon lift or SB with Nursing at [facility]. With therapy, he was working on lateral scoot transfers to w/c. ADLs Comments: Sitting on EOB to complete bathing and dressing. Mod-Max A required.        OT Problem List: Decreased strength;Decreased activity tolerance;Decreased range of motion;Impaired sensation;Decreased coordination;Pain;Decreased knowledge of precautions;Impaired balance (sitting and/or standing);Impaired UE functional  use;Decreased knowledge of use of DME or AE      OT Treatment/Interventions: Self-care/ADL training;Therapeutic activities;Therapeutic exercise;Neuromuscular education;Patient/family education;Energy conservation;DME and/or AE instruction;Balance training;Modalities;Manual therapy    OT Goals(Current goals can be found in the care plan section) Acute Rehab OT Goals Patient Stated Goal: to get some sleep OT Goal Formulation: With patient Time For Goal Achievement: 06/22/22 Potential to Achieve Goals: Good  OT Frequency: Min 2X/week    Co-evaluation PT/OT/SLP Co-Evaluation/Treatment: Yes Reason for Co-Treatment: For patient/therapist safety;To address functional/ADL transfers          AM-PAC OT "6 Clicks" Daily Activity     Outcome Measure Help from another person eating meals?: A Little Help from another person taking care of personal grooming?: A Lot Help from another person toileting, which includes using toliet, bedpan, or urinal?: Total Help from another person bathing (including washing, rinsing, drying)?: A Lot Help from another person to put on and taking off regular upper body clothing?: A Lot Help from another person to put on and taking off regular lower body clothing?: Total 6 Click Score: 11   End of Session Nurse Communication: Patient requests pain meds  Activity Tolerance: Patient tolerated treatment well Patient left: in bed;with call bell/phone within reach;with bed alarm set  OT Visit Diagnosis: Muscle weakness (generalized) (M62.81);Other symptoms and signs involving the nervous system (R29.898)                Time: 1700-1749 OT Time Calculation (min): 45 min Charges:  OT General Charges $OT Visit: 1 Visit OT Evaluation $OT Eval Moderate  Complexity: 1 Mod OT Treatments $Therapeutic Activity: 8-22 mins  Ailene Ravel, OTR/L,CBIS  Supplemental OT - [facility] and [facility] Secure Chat Preferred    Adrick Kestler, Clarene Duke 06/08/2022, 1:21 PM

## 2022-06-08 NOTE — Progress Notes (Signed)
Patient ID: Curtis Hansen, male   DOB: 13-Dec-1955, 67 y.o.   MRN: [id number] Subjective: Patient reports some neck soreness. Seems unmotivated. Feels his arms are a little better than pre-op.  Objective: Vital signs in last 24 hours: Temp:  [97.5 F (36.4 C)-98.3 F (36.8 C)] 98.3 F (36.8 C) (01/09 0747) Pulse Rate:  [72-92] 79 (01/09 0747) Resp:  [8-24] 17 (01/09 0747) BP: (115-159)/(49-99) 136/77 (01/09 0747) SpO2:  [92 %-100 %] 100 % (01/09 0747) Weight:  [91.6 kg] 91.6 kg (01/08 0802)  Intake/Output from previous day: 01/08 0701 - 01/09 0700 In: 1739.8 [P.O.:240; I.V.:1499.8] Out: 925 [Urine:600; Drains:175; Blood:150] Intake/Output this shift: No intake/output data recorded.  He is awake alert, dressing dry, moves hands well, some limitation at shoulder girdle, legs ok to in bed exam.  Lab Results: Lab Results  Component Value Date   WBC 9.2 06/07/2022   HGB 16.2 06/07/2022   HCT 50.0 06/07/2022   MCV 89.3 06/07/2022   PLT 228 06/07/2022   Lab Results  Component Value Date   INR 1.1 06/07/2022   BMET Lab Results  Component Value Date   NA 138 06/07/2022   K 4.0 06/07/2022   CL 106 06/07/2022   CO2 23 06/07/2022   GLUCOSE 95 06/07/2022   BUN 13 06/07/2022   CREATININE 0.74 06/07/2022   CALCIUM 8.9 06/07/2022    Studies/Results: DG Cervical Spine 2 or 3 views  Result Date: 06/07/2022 CLINICAL DATA:  POSTERIOR CERVICAL FUSION EXAM: CERVICAL SPINE-3 FLUOROSCOPIC VIEW COMPARISON:  MRI REPORT 05/10/2022 CERVICAL SPINE FINDINGS: THREE FLUOROSCOPIC SPOT IMAGES SUBMITTED FOR REVIEW DEMONSTRATES PEDICLE SCREWS AND VERTICAL FIXATION RODS NOTED FROM APPROXIMATELY C3 THROUGH C6. IMAGING WAS OBTAINED TO AID IN TREATMENT. PLEASE CORRELATE WITH REAL-TIME FLUOROSCOPY OF 32.9 SECONDS. DAP 15.39 MGY IMPRESSION: Intraoperative fluoroscopy. Electronically Signed   By: Jill Side M.D.   On: 06/07/2022 12:38   DG C-Arm 1-60 Min-No Report  Result Date: 06/07/2022 Fluoroscopy  was utilized by the requesting physician.  No radiographic interpretation.   DG C-Arm 1-60 Min-No Report  Result Date: 06/07/2022 Fluoroscopy was utilized by the requesting physician.  No radiographic interpretation.    Assessment/Plan: Making the expected recovery. Hasn't walked in months, but we will try PT/OT. Will need placement back in his SNF.  Estimated body mass index is 29.83 kg/m as calculated from the following:   Height as of this encounter: '5\' 9"'$  (1.753 m).   Weight as of this encounter: 91.6 kg.    LOS: 1 day    Eustace Moore 06/08/2022, 7:58 AM

## 2022-06-08 NOTE — Evaluation (Signed)
Physical Therapy Evaluation Patient Details Name: Curtis Hansen MRN: [id number] DOB: 05-11-1956 Today's Date: 06/08/2022  History of Present Illness  Patient is a 67 y.o. male admitted for cervical myelopathy. Has been experiencing numbness in weakness in all extremities. Has not walked in 6 months. Has been receiving OT and PT at [facility] and nursing. S/P cervical fusion 06/07/21.  Clinical Impression  Pt is presenting below functional baseline. Currently pt was limited due to pain in the head/neck and fatigue. Pt appears overall frustrated by how long it has taken to get surgery and his current functional status. Limited evaluation today with more time spent on therapeutic listening and education as well as encouragement. Pt is demonstrating 3/5 strength in bil LE and was able to assist with rolling with less help needed for rolling to the R than L due to significant deficits in R RTC musculature. Currently recommending skilled physical therapy services to continue in SNF setting on discharge from acute care hospital setting in order to address functional mobility, increase pt autonomy and decrease risk for immobility and skin integrity issues.      Recommendations for follow up therapy are one component of a multi-disciplinary discharge planning process, led by the attending physician.  Recommendations may be updated based on patient status, additional functional criteria and insurance authorization.  Follow Up Recommendations Skilled nursing-short term rehab (<3 hours/day) Can patient physically be transported by private vehicle: No    Assistance Recommended at Discharge Frequent or constant Supervision/Assistance  Patient can return home with the following  Two people to help with walking and/or transfers;Assist for transportation;Help with stairs or ramp for entrance    Equipment Recommendations Other (comment) (Defer to post acute)  Recommendations for Other Services        Functional Status Assessment Patient has had a recent decline in their functional status and demonstrates the ability to make significant improvements in function in a reasonable and predictable amount of time.     Precautions / Restrictions Precautions Precautions: Cervical Precaution Comments: No functional bilateral shoulder movement due to baseline RTC deficits. Hx of two RTC surgeries with right shoulder. Restrictions Weight Bearing Restrictions: No Other Position/Activity Restrictions: no brace needed per MD order      Mobility  Bed Mobility Overal bed mobility: Needs Assistance Bed Mobility: Rolling Rolling: Max assist, +2 for physical assistance         General bed mobility comments: Pt required more assistance to roll to the left due to RTC issues that are more prominent on the right side. Utilized bed rails to assist Patient Response: Flat affect  Transfers                   General transfer comment: Pt declined due to pain and fatigue.    Ambulation/Gait               General Gait Details: Unable at this time.       Balance Overall balance assessment:  (unable to assess today due to pt declining OOB activities)           Pertinent Vitals/Pain Pain Assessment Pain Assessment: 0-10 Pain Score: 7  Pain Location: head Pain Descriptors / Indicators: Aching Pain Intervention(s): Patient requesting pain meds-RN notified, Limited activity within patient's tolerance, Monitored during session    Home Living Family/patient expects to be discharged to:: Skilled nursing facility                 Home Equipment: Wheelchair -  manual;Rolling Walker (2 wheels) Additional Comments: Is active with OT and PT    Prior Function    Mobility Comments: Pt reports bed to w/c transfer completed with Harrel Lemon lift or SB with Nursing at [facility]. With therapy, he was working on lateral scoot transfers to w/c. ADLs Comments: Sitting on EOB to complete  bathing and dressing. Mod-Max A required.     Hand Dominance   Dominant Hand: Left    Extremity/Trunk Assessment   Upper Extremity Assessment Upper Extremity Assessment: Defer to OT evaluation RUE Deficits / Details: Baseline RTC defcits. Unable to demonstrate active or passive ROM shoulder flexion, abduction. Elbow, wrist, hand ranges WFL A/ROM. MMT: 4+/5 elbow, wrist in all ranges. Decreased gross grasp. LUE Deficits / Details: Baseline RTC defcits. Unable to demonstrate active or passive ROM shoulder flexion, abduction. Elbow, wrist, hand ranges WFL A/ROM. MMT: 4+/5 elbow, wrist in all ranges. Decreased gross grasp.    Lower Extremity Assessment Lower Extremity Assessment: Generalized weakness (Able to move against gravity demonstrating 3/5 strength in bil LE)    Cervical / Trunk Assessment Cervical / Trunk Assessment: Neck Surgery  Communication   Communication: No difficulties  Cognition Arousal/Alertness: Awake/alert Behavior During Therapy: Flat affect Overall Cognitive Status: Within Functional Limits for tasks assessed          General Comments General comments (skin integrity, edema, etc.): Pt was very frustrated with medical care in general not just within the [facility] system. Pt initially was slightly resistant but with conversation was able to warm up and was willing to move some. Pt has alot of pain in bil shoulders with movement due to bil rotator cuff issues. He was working with OT/PT prior to hospitalization and states he was using a sliding board and able to stand for varying length of time before his knees buckled.        Assessment/Plan    PT Assessment Patient needs continued PT services  PT Problem List Decreased strength;Decreased mobility;Decreased activity tolerance;Pain       PT Treatment Interventions DME instruction;Functional mobility training;Balance training;Patient/family education;Gait training;Therapeutic activities;Neuromuscular  re-education;Stair training;Therapeutic exercise;Manual techniques    PT Goals (Current goals can be found in the Care Plan section)  Acute Rehab PT Goals Patient Stated Goal: To get stronger PT Goal Formulation: With patient Time For Goal Achievement: 06/22/22 Potential to Achieve Goals: Fair    Frequency Min 5X/week     Co-evaluation PT/OT/SLP Co-Evaluation/Treatment: Yes Reason for Co-Treatment: For patient/therapist safety;To address functional/ADL transfers;Necessary to address cognition/behavior during functional activity PT goals addressed during session: Mobility/safety with mobility         AM-PAC PT "6 Clicks" Mobility  Outcome Measure Help needed turning from your back to your side while in a flat bed without using bedrails?: A Lot Help needed moving from lying on your back to sitting on the side of a flat bed without using bedrails?: Total Help needed moving to and from a bed to a chair (including a wheelchair)?: Total Help needed standing up from a chair using your arms (e.g., wheelchair or bedside chair)?: Total Help needed to walk in hospital room?: Total Help needed climbing 3-5 steps with a railing? : Total 6 Click Score: 7    End of Session   Activity Tolerance: Patient limited by pain Patient left: in bed;with call bell/phone within reach;with bed alarm set Nurse Communication: Mobility status PT Visit Diagnosis: Other abnormalities of gait and mobility (R26.89)    Time: 1027-2536 PT Time Calculation (min) (ACUTE ONLY):  43 min   Charges:   PT Evaluation $PT Eval Moderate Complexity: [place], DPT, CLT  Acute Rehabilitation Services Office: [phone] (Secure chat preferred)   Ander Purpura 06/08/2022, 2:03 PM

## 2022-06-09 LAB — GLUCOSE, CAPILLARY
Glucose-Capillary: 174 mg/dL — ABNORMAL HIGH (ref 70–99)
Glucose-Capillary: 96 mg/dL (ref 70–99)
Glucose-Capillary: 151 mg/dL — ABNORMAL HIGH (ref 70–99)
Glucose-Capillary: 147 mg/dL — ABNORMAL HIGH (ref 70–99)

## 2022-06-09 NOTE — TOC Initial Note (Signed)
Transition of Care [facility]) - Initial/Assessment Note    Patient Details  Name: Curtis Hansen MRN: [id number] Date of Birth: [birth year]  Transition of Care [facility]) CM/SW Contact:    Joanne Chars, LCSW Phone Number: 06/09/2022, 3:19 PM  Clinical Narrative:    CSW met with pt for initial assessment.  Pt confirms he is from [facility], has been there for past 6 months, he is planning to return, hopes ultimately to move back out into his own house. Permission given to speak with nephew Will.  CSW spoke with [facility] and confirmed they can accept pt back tomorrow.               Inpatient order dated 06/07/22.  Expected Discharge Plan: [place] Barriers to Discharge: No Barriers Identified   Patient Goals and CMS Choice Patient states their goals for this hospitalization and ongoing recovery are:: walk, back to normal   Choice offered to / list presented to : Patient      Expected Discharge Plan and Services In-house Referral: Clinical Social Work   Post Acute Care Choice: [place] Living arrangements for the past 2 months: [place] [facility])                                      Prior Living Arrangements/Services Living arrangements for the past 2 months: [place] ([facility]) Lives with:: Facility Resident Patient language and need for interpreter reviewed:: Yes Do you feel safe going back to the place where you live?: Yes      Need for Family Participation in Patient Care: No (Comment) Care giver support system in place?: Yes (comment) Current home services: Other (comment) (na) Criminal Activity/Legal Involvement Pertinent to Current Situation/Hospitalization: No - Comment as needed  Activities of Daily Living Home Assistive Devices/Equipment: Eyeglasses, Wheelchair ADL Screening (condition at time of admission) Patient's cognitive ability adequate to safely complete  daily activities?: Yes Is the patient deaf or have difficulty hearing?: No Does the patient have difficulty seeing, even when wearing glasses/contacts?: No Does the patient have difficulty concentrating, remembering, or making decisions?: No Patient able to express need for assistance with ADLs?: Yes Does the patient have difficulty dressing or bathing?: Yes Independently performs ADLs?: No Communication: Independent Dressing (OT): Needs assistance Is this a change from baseline?: Pre-admission baseline Grooming: Independent Feeding: Independent Bathing: Needs assistance Is this a change from baseline?: Pre-admission baseline Toileting: Needs assistance Is this a change from baseline?: Pre-admission baseline In/Out Bed: Needs assistance Is this a change from baseline?: Pre-admission baseline Does the patient have difficulty walking or climbing stairs?: Yes Weakness of Legs: Both Weakness of Arms/Hands: None  Permission Sought/Granted Permission sought to share information with : Family Supports Permission granted to share information with : Yes, Verbal Permission Granted  Share Information with NAME: nephew Will  Permission granted to share info w AGENCY: [employer]        Emotional Assessment Appearance:: Appears stated age Attitude/Demeanor/Rapport: Engaged Affect (typically observed): Appropriate, Irritable Orientation: : Oriented to Self, Oriented to Place, Oriented to  Time, Oriented to Situation      Admission diagnosis:  S/P cervical spinal fusion [Z98.1] Patient Active Problem List   Diagnosis Date Noted   S/P cervical spinal fusion 06/07/2022   Constipation 02/15/2022   Encounter for colonoscopy due to history of adenomatous colonic polyps 02/15/2022   Cellulitis and  abscess of leg 11/18/2021   PCP:  Moss Mc, NP Pharmacy:  No Pharmacies Listed    Social Determinants of Health (SDOH) Social History: SDOH Screenings   Food Insecurity: No Food  Insecurity (06/07/2022)  Housing: Low Risk  (06/07/2022)  Transportation Needs: No Transportation Needs (06/07/2022)  Utilities: Not At Risk (06/07/2022)  Tobacco Use: Low Risk  (06/08/2022)   SDOH Interventions:     Readmission Risk Interventions     No data to display

## 2022-06-09 NOTE — NC FL2 (Signed)
[place] MEDICAID FL2 LEVEL OF CARE FORM     IDENTIFICATION  Patient Name: Curtis Hansen Birthdate: 10/22/1955 Sex: male Admission Date (Current Location): 06/07/2022  [facility] and [place] Number:  Herbalist and Address:  [facility] [place] [place] [address] [place], [place] [address]      Provider Number: [id number]  Attending Physician Name and Address:  Eustace Moore, MD  Relative Name and Phone Number:  obie, kallenbach   [phone]    Current Level of Care: Hospital Recommended Level of Care: [place] Prior Approval Number:    Date Approved/Denied:   PASRR Number: [id number]  Discharge Plan: SNF    Current Diagnoses: Patient Active Problem List   Diagnosis Date Noted   S/P cervical spinal fusion 06/07/2022   Constipation 02/15/2022   Encounter for colonoscopy due to history of adenomatous colonic polyps 02/15/2022   Cellulitis and abscess of leg 11/18/2021    Orientation RESPIRATION BLADDER Height & Weight     Self, Time, Situation, Place  Normal Continent Weight: 202 lb (91.6 kg) Height:  '5\' 9"'$  (175.3 cm)  BEHAVIORAL SYMPTOMS/MOOD NEUROLOGICAL BOWEL NUTRITION STATUS      Continent Diet (see discharge summary)  AMBULATORY STATUS COMMUNICATION OF NEEDS Skin   Total Care Verbally Surgical wounds                       Personal Care Assistance Level of Assistance  Bathing, Feeding, Dressing, Total care Bathing Assistance: Maximum assistance Feeding assistance: Limited assistance Dressing Assistance: Maximum assistance Total Care Assistance: Maximum assistance   Functional Limitations Info  Sight, Hearing, Speech Sight Info: Adequate Hearing Info: Adequate Speech Info: Adequate    SPECIAL CARE FACTORS FREQUENCY  PT (By licensed PT), OT (By licensed OT)     PT Frequency: 5x week OT Frequency: 5x week            Contractures Contractures Info: Not present    Additional Factors Info   Code Status, Allergies, Insulin Sliding Scale Code Status Info: full Allergies Info: Rosuvastatin, Grass Pollen([id number] Vern), Celecoxib   Insulin Sliding Scale Info: Novolog: see discharge summary       Current Medications (06/09/2022):  This is the current hospital active medication list Current Facility-Administered Medications  Medication Dose Route Frequency Provider Last Rate Last Admin   0.9 %  sodium chloride infusion  250 mL Intravenous Continuous Eustace Moore, MD   Stopped at 06/07/22 1706   bisacodyl (DULCOLAX) suppository 10 mg  10 mg Rectal Daily PRN Eustace Moore, MD       diphenhydrAMINE (BENADRYL) capsule 25 mg  25 mg Oral QHS PRN Eustace Moore, MD       feeding supplement (ENSURE ENLIVE / ENSURE PLUS) liquid 237 mL  1 Bottle Oral TID BM Eustace Moore, MD   237 mL at 06/09/22 1257   fenofibrate tablet 160 mg  160 mg Oral Daily Eustace Moore, MD   160 mg at 06/09/22 0800   insulin aspart (novoLOG) injection 0-15 Units  0-15 Units Subcutaneous TID WC Eustace Moore, MD   3 Units at 06/09/22 1248   LORazepam (ATIVAN) tablet 1 mg  1 mg Oral Q8H PRN Eustace Moore, MD       menthol-cetylpyridinium (CEPACOL) lozenge 3 mg  1 lozenge Oral PRN Eustace Moore, MD       Or   phenol (CHLORASEPTIC) mouth spray 1 spray  1 spray  Mouth/Throat PRN Eustace Moore, MD       methocarbamol (ROBAXIN) tablet 500 mg  500 mg Oral Q6H PRN Eustace Moore, MD   500 mg at 06/09/22 1751   Or   methocarbamol (ROBAXIN) 500 mg in dextrose 5 % 50 mL IVPB  500 mg Intravenous Q6H PRN Eustace Moore, MD       metoprolol succinate (TOPROL-XL) 24 hr tablet 12.5 mg  12.5 mg Oral Daily Eustace Moore, MD   12.5 mg at 06/09/22 0800   morphine (PF) 2 MG/ML injection 2 mg  2 mg Intravenous Q2H PRN Eustace Moore, MD       ondansetron [facility]) tablet 4 mg  4 mg Oral Q6H PRN Eustace Moore, MD   4 mg at 06/07/22 1515   Or   ondansetron (ZOFRAN) injection 4 mg  4 mg Intravenous Q6H PRN Eustace Moore, MD        oxyCODONE (Oxy IR/ROXICODONE) immediate release tablet 10 mg  10 mg Oral Q3H PRN Eustace Moore, MD   10 mg at 06/09/22 1257   polyethylene glycol (MIRALAX / GLYCOLAX) packet 17 g  17 g Oral BID Eustace Moore, MD   17 g at 06/09/22 0800   pregabalin (LYRICA) capsule 150 mg  150 mg Oral TID Eustace Moore, MD   150 mg at 06/09/22 0800   senna (SENOKOT) tablet 8.6 mg  1 tablet Oral BID Eustace Moore, MD   8.6 mg at 06/09/22 0800   simethicone (MYLICON) chewable tablet 80 mg  80 mg Oral TID WC & HS Eustace Moore, MD   80 mg at 06/09/22 1247   sodium chloride flush (NS) 0.9 % injection 3 mL  3 mL Intravenous Q12H Eustace Moore, MD   3 mL at 06/09/22 0805   sodium chloride flush (NS) 0.9 % injection 3 mL  3 mL Intravenous PRN Eustace Moore, MD       sodium chloride tablet 1 g  1 g Oral Daily Eustace Moore, MD   1 g at 06/09/22 0800   tamsulosin (FLOMAX) capsule 0.4 mg  0.4 mg Oral Daily Eustace Moore, MD   0.4 mg at 06/09/22 0800   zinc sulfate capsule 220 mg  220 mg Oral Daily Eustace Moore, MD   220 mg at 06/09/22 0800     Discharge Medications: Please see discharge summary for a list of discharge medications.  Relevant Imaging Results:  Relevant Lab Results:   Additional Information SSN: [id number]  Joanne Chars, LCSW

## 2022-06-09 NOTE — Progress Notes (Signed)
Patient ID: Curtis Hansen, male   DOB: 01-10-1956, 67 y.o.   MRN: [id number] Subjective: Patient reports appropriate soreness, no arm pain or NTW  Objective: Vital signs in last 24 hours: Temp:  [98.7 F (37.1 C)-98.9 F (37.2 C)] 98.9 F (37.2 C) (01/10 0741) Pulse Rate:  [62-78] 78 (01/10 0741) Resp:  [16-17] 16 (01/10 0741) BP: (108-148)/(39-75) 148/75 (01/10 0741) SpO2:  [96 %] 96 % (01/10 0741)  Intake/Output from previous day: 01/09 0701 - 01/10 0700 In: -  Out: 480 [Urine:450; Drains:30] Intake/Output this shift: No intake/output data recorded.  Exam is stable, feeding himself breakfast at prresent  Lab Results: Lab Results  Component Value Date   WBC 9.2 06/07/2022   HGB 16.2 06/07/2022   HCT 50.0 06/07/2022   MCV 89.3 06/07/2022   PLT 228 06/07/2022   Lab Results  Component Value Date   INR 1.1 06/07/2022   BMET Lab Results  Component Value Date   NA 138 06/07/2022   K 4.0 06/07/2022   CL 106 06/07/2022   CO2 23 06/07/2022   GLUCOSE 95 06/07/2022   BUN 13 06/07/2022   CREATININE 0.74 06/07/2022   CALCIUM 8.9 06/07/2022    Studies/Results: DG Cervical Spine 2 or 3 views  Result Date: 06/07/2022 CLINICAL DATA:  POSTERIOR CERVICAL FUSION EXAM: CERVICAL SPINE-3 FLUOROSCOPIC VIEW COMPARISON:  MRI REPORT 05/10/2022 CERVICAL SPINE FINDINGS: THREE FLUOROSCOPIC SPOT IMAGES SUBMITTED FOR REVIEW DEMONSTRATES PEDICLE SCREWS AND VERTICAL FIXATION RODS NOTED FROM APPROXIMATELY C3 THROUGH C6. IMAGING WAS OBTAINED TO AID IN TREATMENT. PLEASE CORRELATE WITH REAL-TIME FLUOROSCOPY OF 32.9 SECONDS. DAP 15.39 MGY IMPRESSION: Intraoperative fluoroscopy. Electronically Signed   By: Jill Side M.D.   On: 06/07/2022 12:38   DG C-Arm 1-60 Min-No Report  Result Date: 06/07/2022 Fluoroscopy was utilized by the requesting physician.  No radiographic interpretation.   DG C-Arm 1-60 Min-No Report  Result Date: 06/07/2022 Fluoroscopy was utilized by the requesting physician.  No  radiographic interpretation.    Assessment/Plan: Making the expected recovery thus far  PT/OT  SNF - plan D/C tomorrow  Estimated body mass index is 29.83 kg/m as calculated from the following:   Height as of this encounter: '5\' 9"'$  (1.753 m).   Weight as of this encounter: 91.6 kg.    LOS: 2 days    Eustace Moore 06/09/2022, 8:05 AM

## 2022-06-09 NOTE — Progress Notes (Signed)
Physical Therapy Treatment Patient Details Name: Curtis Hansen MRN: [id number] DOB: 1955-12-12 Today's Date: 06/09/2022   History of Present Illness Patient is a 67 y.o. male admitted for cervical myelopathy. Has been experiencing numbness in weakness in all extremities. Has not walked in 6 months. Has been receiving OT and PT at [facility] and nursing. S/P cervical fusion 06/07/21.    PT Comments    Pt is slowly progressing toward goals. Pt was able to sit EOB this session and roll to assist with changing linens. Requires Max A for rolling and Total assist to get to supine<>sitting due to pain in cervical spine and weakness in the shoulders. Due to pt current functional status recommending skilled physical therapy services in SNF setting on discharge from acute care hospital setting in order to decrease level of physical assist required by care givers, improve mobility and decrease risk for falls/re-hospitalization. No signs/symptoms of cardiac/respiratory distress throughout session.    Recommendations for follow up therapy are one component of a multi-disciplinary discharge planning process, led by the attending physician.  Recommendations may be updated based on patient status, additional functional criteria and insurance authorization.  Follow Up Recommendations  Skilled nursing-short term rehab (<3 hours/day) Can patient physically be transported by private vehicle: No   Assistance Recommended at Discharge Frequent or constant Supervision/Assistance  Patient can return home with the following Two people to help with walking and/or transfers;Assist for transportation;Help with stairs or ramp for entrance   Equipment Recommendations  Other (comment) (defer to post acute)    Recommendations for Other Services       Precautions / Restrictions Precautions Precautions: Cervical Precaution Comments: No functional bilateral shoulder movement due to baseline RTC deficits. Hx of  two RTC surgeries with right shoulder. Restrictions Weight Bearing Restrictions: No Other Position/Activity Restrictions: no brace needed per MD order     Mobility  Bed Mobility Overal bed mobility: Needs Assistance Bed Mobility: Rolling, Supine to Sit, Sit to Supine Rolling: Max assist   Supine to sit: +2 for physical assistance, Max assist Sit to supine: +2 for physical assistance, Max assist   General bed mobility comments: Pt required more assistance to roll to the left due to RTC issues that are more prominent on the right side. Utilized bed rails to assist. Requires assistance at the trunk to lift and stabilize cervical spine due to pain and with bil LE for getting on/off bed due to weakness. Patient Response: Flat affect  Transfers                   General transfer comment: Pt declined, sat EOB today after rolling yesterday. Progressing slowly to pt tolerance.    Ambulation/Gait               General Gait Details: Unable at this time.        Balance Overall balance assessment: Needs assistance Sitting-balance support: Bilateral upper extremity supported, Single extremity supported Sitting balance-Leahy Scale: Fair Sitting balance - Comments: with and without feet supported pt initially required Min A due to guarding from pain after relaxing into the pain pt was able to use UE to balance while sitting EOB.          Cognition Arousal/Alertness: Awake/alert Behavior During Therapy: Flat affect Overall Cognitive Status: Within Functional Limits for tasks assessed             General Comments General comments (skin integrity, edema, etc.): Pt continues to be frustrated with medical care  in general not just wtihin the [facility] system. Pt needs encouragement to progress mobility. On sitting pt had increased pain in cervical spine that did not increase with time sitting.      Pertinent Vitals/Pain Pain Assessment Pain Score: 4  Pain Location:  head Pain Descriptors / Indicators: Aching Pain Intervention(s): Limited activity within patient's tolerance, Premedicated before session, Monitored during session, Repositioned     PT Goals (current goals can now be found in the care plan section) Acute Rehab PT Goals Patient Stated Goal: To get stronger PT Goal Formulation: With patient Time For Goal Achievement: 06/22/22 Potential to Achieve Goals: Fair Progress towards PT goals: Progressing toward goals    Frequency    Min 5X/week      PT Plan Current plan remains appropriate       AM-PAC PT "6 Clicks" Mobility   Outcome Measure  Help needed turning from your back to your side while in a flat bed without using bedrails?: A Lot Help needed moving from lying on your back to sitting on the side of a flat bed without using bedrails?: Total Help needed moving to and from a bed to a chair (including a wheelchair)?: Total Help needed standing up from a chair using your arms (e.g., wheelchair or bedside chair)?: Total Help needed to walk in hospital room?: Total Help needed climbing 3-5 steps with a railing? : Total 6 Click Score: 7    End of Session   Activity Tolerance: Patient limited by pain Patient left: in bed;with call bell/phone within reach;with bed alarm set Nurse Communication: Mobility status PT Visit Diagnosis: Other abnormalities of gait and mobility (R26.89)     Time: 1000-1028 PT Time Calculation (min) (ACUTE ONLY): 28 min  Charges:  $Therapeutic Activity: 23-37 mins                    Tomma Rakers, DPT, [place] Office: [phone] (Secure chat preferred)    Ander Purpura 06/09/2022, 11:34 AM

## 2022-06-10 LAB — GLUCOSE, CAPILLARY
Glucose-Capillary: 224 mg/dL — ABNORMAL HIGH (ref 70–99)
Glucose-Capillary: 145 mg/dL — ABNORMAL HIGH (ref 70–99)

## 2022-06-10 MED ORDER — OXYCODONE HCL 5 MG PO TABA: 5.0000 mg | ORAL_TABLET | Freq: Four times a day (QID) | ORAL | 0 refills | Status: DC | PRN

## 2022-06-10 NOTE — Care Management Important Message (Signed)
Important Message  Patient Details  Name: Curtis Hansen MRN: [id number] Date of Birth: 10-23-1955   Medicare Important Message Given:  Yes     Hannah Beat 06/10/2022, 2:08 PM

## 2022-06-10 NOTE — TOC Transition Note (Signed)
Transition of Care [facility]) - CM/SW Discharge Note   Patient Details  Name: Curtis Hansen MRN: [id number] Date of Birth: 1955/07/28  Transition of Care [facility]) CM/SW Contact:  Joanne Chars, LCSW Phone Number: 06/10/2022, 10:14 AM   Clinical Narrative:   Pt discharging to [facility], rom A1-1.  RN call report to [phone].     Final next level of care: Skilled Nursing Facility Barriers to Discharge: No Barriers Identified   Patient Goals and CMS Choice   Choice offered to / list presented to : Patient  Discharge Placement                Patient chooses bed at:  [facility]) Patient to be transferred to facility by: [place] Name of family member notified: nephew Will Patient and family notified of of transfer: 06/10/22  Discharge Plan and Services Additional resources added to the After Visit Summary for   In-house Referral: Clinical Social Work   Post Acute Care Choice: [place]                               Social Determinants of Health (SDOH) Interventions SDOH Screenings   Food Insecurity: No Food Insecurity (06/07/2022)  Housing: Low Risk  (06/07/2022)  Transportation Needs: No Transportation Needs (06/07/2022)  Utilities: Not At Risk (06/07/2022)  Tobacco Use: Low Risk  (06/08/2022)     Readmission Risk Interventions     No data to display

## 2022-06-10 NOTE — Progress Notes (Signed)
Report given to [facility] at [facility]

## 2022-06-10 NOTE — Progress Notes (Signed)
Physical Therapy Treatment Patient Details Name: Curtis Hansen MRN: [id number] DOB: [birth year] Today's Date: 06/10/2022   History of Present Illness Patient is a 67 y.o. male admitted for cervical myelopathy. Has been experiencing numbness in weakness in all extremities. Has not walked in 6 months. Has been receiving OT and PT at [facility] and nursing. S/P cervical fusion 06/07/21.    PT Comments    Pt received in supine, A&O to self, situation and location (not further assessed), pt with depressed affect but receptive to instruction on cervical precautions, log rolling, and skin protection given urinary incontinence. Pt needing up to +2 maxA to roll toward each side (multiple reps) for hygiene assist and bed linen change due to urinary incontinence with malfunction of male purewick, NT in room to assist with process. Pt given briefs and handout to reinforce log rolling, he was able to perform some AROM and some AAROM of BLE and BUE in supine/sidelying postures. Session time limited due to arrival of PTAR after pt cleaned up in bed. Pt making slow progress toward PT goals.   Recommendations for follow up therapy are one component of a multi-disciplinary discharge planning process, led by the attending physician.  Recommendations may be updated based on patient status, additional functional criteria and insurance authorization.  Follow Up Recommendations  Skilled nursing-short term rehab (<3 hours/day) Can patient physically be transported by private vehicle: No   Assistance Recommended at Discharge Frequent or constant Supervision/Assistance  Patient can return home with the following Two people to help with walking and/or transfers;Assist for transportation;Help with stairs or ramp for entrance   Equipment Recommendations  Other (comment) (defer to post acute)    Recommendations for Other Services       Precautions / Restrictions Precautions Precautions: Cervical Precaution  Booklet Issued: Yes (comment) (reviewed log rolling and bed mobility safety as well as no lifting >10lb until MD clears him) Precaution Comments: No functional bilateral shoulder movement due to baseline RTC deficits. Hx of two RTC surgeries with right shoulder. Required Braces or Orthoses:  (no brace needed per MD notes) Restrictions Weight Bearing Restrictions: No Other Position/Activity Restrictions: no brace needed per MD order     Mobility  Bed Mobility Overal bed mobility: Needs Assistance Bed Mobility: Rolling Rolling: Max assist, +2 for physical assistance         General bed mobility comments: Pt required more assistance to roll to the left due to RTC issues that are more prominent on the right side. Utilized bed rails to assist, dense cues for log roll sequencing/to avoid cx and back twisting, pt given cx precs handout to reinforce log rolling technique; rolling x4 reps and in each direction due to pt noted to have urinary incontinence and bed sheets as well as his jeans were damp from urine. NT called to room to assist with bed change and log rolling/hygiene. UTA upright posture as PTAR arriving for transport to SNF at end of session.    Transfers                   General transfer comment: UTA, session time limited due to DC    Ambulation/Gait               General Gait Details: Unable at this time.   Stairs             Wheelchair Mobility    Modified Rankin (Stroke Patients Only)       Balance Overall balance  assessment: Needs assistance     Sitting balance - Comments: UTA, PTAR arriving                                    Cognition Arousal/Alertness: Awake/alert Behavior During Therapy: Flat affect Overall Cognitive Status: Within Functional Limits for tasks assessed                                 General Comments: Pt reports sadness from brother and mother passing away in 2023. He is hopeful to work on  taking care of family properties and eventually returning to his home.        Exercises Other Exercises Other Exercises: supine BLE A/AAROM: heel slides, ankle pumps x10 reps ea Other Exercises: BUE elbow extension and some AAROM shoulder flexion to reach toward opposite bed rails during log rolling x4-5 reps ea side Other Exercises: SAQ x3-5 reps ea LE in sidelying    General Comments General comments (skin integrity, edema, etc.): pt c/o grief since passing of multiple family members this year, may benefit from chaplain consult at SNF. Urinary incontinence, pt seemingly unaware; briefs brought to room and placed along with condom cath during session.      Pertinent Vitals/Pain Pain Assessment Pain Assessment: Faces Faces Pain Scale: Hurts even more Pain Location: surgical site with HOB elevated and some LE radiating pain with bed mobility movements Pain Descriptors / Indicators: Aching, Discomfort, Grimacing, Moaning Pain Intervention(s): Limited activity within patient's tolerance, Monitored during session, Repositioned     PT Goals (current goals can now be found in the care plan section) Acute Rehab PT Goals Patient Stated Goal: To get stronger PT Goal Formulation: With patient Time For Goal Achievement: 06/22/22 Progress towards PT goals: Progressing toward goals    Frequency    Min 5X/week      PT Plan Current plan remains appropriate       AM-PAC PT "6 Clicks" Mobility   Outcome Measure  Help needed turning from your back to your side while in a flat bed without using bedrails?: Total (up to +2 maxA) Help needed moving from lying on your back to sitting on the side of a flat bed without using bedrails?: Total Help needed moving to and from a bed to a chair (including a wheelchair)?: Total Help needed standing up from a chair using your arms (e.g., wheelchair or bedside chair)?: Total Help needed to walk in hospital room?: Total Help needed climbing 3-5 steps  with a railing? : Total 6 Click Score: 6    End of Session   Activity Tolerance: Other (comment) (arrival of transport limiting session time) Patient left: in bed;with call bell/phone within reach;Other (comment);with nursing/sitter in room (PTAR entering the room) Nurse Communication: Mobility status;Need for lift equipment;Precautions (incontinence) PT Visit Diagnosis: Other abnormalities of gait and mobility (R26.89)     Time: 5465-6812 PT Time Calculation (min) (ACUTE ONLY): 31 min  Charges:  $Therapeutic Exercise: 8-22 mins $Therapeutic Activity: 8-22 mins                     Rees Santistevan P., PTA Acute Rehabilitation Services Secure Chat Preferred 9a-5:30pm Office: [place] 06/10/2022, 11:56 AM

## 2022-06-10 NOTE — Discharge Summary (Signed)
Physician Discharge Summary  Patient ID: Curtis Hansen MRN: [id number] DOB/AGE: Mar 17, 1956 67 y.o.  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 The patient tolerated the procedure well and was taken to the recovery room and then to the floor in stable condition. The hospital course was routine. There were no complications. The wound remained clean dry and intact. Pt had appropriate neck soreness. No complaints of arm pain or new N/T/W. The patient remained afebrile with stable vital signs, and tolerated a regular diet. The patient continued to increase activities, and pain was well controlled with oral pain medications.   Consults: None  Significant Diagnostic Studies:  Results for orders placed or performed during the hospital encounter of 06/07/22  Surgical pcr screen   Specimen: Nasal Mucosa; Nasal Swab  Result Value Ref Range   MRSA, PCR NEGATIVE NEGATIVE   Staphylococcus aureus NEGATIVE NEGATIVE  CBC per protocol  Result Value Ref Range   WBC 9.2 4.0 - 10.5 K/uL   RBC 5.60 4.22 - 5.81 MIL/uL   Hemoglobin 16.2 13.0 - 17.0 g/dL   HCT 50.0 39.0 - 52.0 %   MCV 89.3 80.0 - 100.0 fL   MCH 28.9 26.0 - 34.0 pg   MCHC 32.4 30.0 - 36.0 g/dL   RDW 14.5 11.5 - 15.5 %   Platelets 228 150 - 400 K/uL   nRBC 0.0 0.0 - 0.2 %  Basic metabolic panel per protocol  Result Value Ref Range   Sodium 138 135 - 145 mmol/L   Potassium 4.0 3.5 - 5.1 mmol/L   Chloride 106 98 - 111 mmol/L   CO2 23 22 - 32 mmol/L   Glucose, Bld 95 70 - 99 mg/dL   BUN 13 8 - 23 mg/dL   Creatinine, Ser 0.74 0.61 - 1.24 mg/dL   Calcium 8.9 8.9 - 10.3 mg/dL   GFR, Estimated >60  >60 mL/min   Anion gap 9 5 - 15  Glucose, capillary  Result Value Ref Range   Glucose-Capillary 113 (H) 70 - 99 mg/dL  Protime-INR  Result Value Ref Range   Prothrombin Time 13.9 11.4 - 15.2 seconds   INR 1.1 0.8 - 1.2  Glucose, capillary  Result Value Ref Range   Glucose-Capillary 138 (H) 70 - 99 mg/dL  Hemoglobin A1c  Result Value Ref Range   Hgb A1c MFr Bld 5.9 (H) 4.8 - 5.6 %   Mean Plasma Glucose 122.63 mg/dL  Glucose, capillary  Result Value Ref Range   Glucose-Capillary 168 (H) 70 - 99 mg/dL  Glucose, capillary  Result Value Ref Range   Glucose-Capillary 210 (H) 70 - 99 mg/dL  Glucose, capillary  Result Value Ref Range   Glucose-Capillary 123 (H) 70 - 99 mg/dL  Glucose, capillary  Result Value Ref Range   Glucose-Capillary 138 (H) 70 - 99 mg/dL  Glucose, capillary  Result Value Ref Range   Glucose-Capillary 189 (H) 70 - 99 mg/dL  Glucose, capillary  Result Value Ref Range   Glucose-Capillary 174 (H) 70 - 99 mg/dL  Glucose, capillary  Result Value Ref Range   Glucose-Capillary 96 70 - 99 mg/dL  Glucose, capillary  Result Value Ref Range   Glucose-Capillary 151 (H) 70 - 99 mg/dL  Glucose, capillary  Result Value Ref Range   Glucose-Capillary 147 (H) 70 - 99 mg/dL  Glucose, capillary  Result Value Ref Range   Glucose-Capillary 145 (H) 70 - 99 mg/dL  Glucose, capillary  Result Value Ref Range   Glucose-Capillary 224 (H) 70 - 99 mg/dL  Type and screen [place]  Result Value Ref Range   ABO/RH(D) O POS    Antibody Screen NEG    Sample Expiration      06/10/2022,2359 Performed at [facility] [address], [place], [place] [address]   ABO/Rh  Result Value Ref Range   ABO/RH(D)      O POS Performed at [facility] [address] [place], [place] [address]     DG Cervical Spine 2 or 3 views  Result Date: 06/07/2022 CLINICAL DATA:  POSTERIOR CERVICAL FUSION EXAM: CERVICAL SPINE-3 FLUOROSCOPIC VIEW COMPARISON:  MRI  REPORT 05/10/2022 CERVICAL SPINE FINDINGS: THREE FLUOROSCOPIC SPOT IMAGES SUBMITTED FOR REVIEW DEMONSTRATES PEDICLE SCREWS AND VERTICAL FIXATION RODS NOTED FROM APPROXIMATELY C3 THROUGH C6. IMAGING WAS OBTAINED TO AID IN TREATMENT. PLEASE CORRELATE WITH REAL-TIME FLUOROSCOPY OF 32.9 SECONDS. DAP 15.39 MGY IMPRESSION: Intraoperative fluoroscopy. Electronically Signed   By: Jill Side M.D.   On: 06/07/2022 12:38   DG C-Arm 1-60 Min-No Report  Result Date: 06/07/2022 Fluoroscopy was utilized by the requesting physician.  No radiographic interpretation.   DG C-Arm 1-60 Min-No Report  Result Date: 06/07/2022 Fluoroscopy was utilized by the requesting physician.  No radiographic interpretation.    Antibiotics:  Anti-infectives (From admission, onward)    Start     Dose/Rate Route Frequency Ordered Stop   06/07/22 1545  ceFAZolin (ANCEF) IVPB 2g/100 mL premix        2 g 200 mL/hr over 30 Minutes Intravenous Every 8 hours 06/07/22 1452 06/08/22 0113   06/07/22 1139  vancomycin (VANCOCIN) powder  Status:  Discontinued          As needed 06/07/22 1139 06/07/22 1253   06/07/22 0830  ceFAZolin (ANCEF) IVPB 2g/100 mL premix        2 g 200 mL/hr over 30 Minutes Intravenous On call to O.R. 06/07/22 0822 06/07/22 1044       Discharge Exam: Blood pressure (!) 144/85, pulse 82, temperature 99.6 F (37.6 C), temperature source Oral, resp. rate 17, height '5\' 9"'$  (1.753 m), weight 91.6 kg, SpO2 96 %. Neurologic: Grossly normal Ambulating and voiding well incision cdi   Discharge Medications:   Allergies as of 06/10/2022       Reactions   Rosuvastatin Nausea Only   Grass Pollen([id number] Vern) Cough   Celecoxib Other (See Comments)        Medication List     TAKE these medications    acetaminophen 325 MG tablet Commonly known as: TYLENOL Take 650 mg by mouth every 6 (six) hours as needed for mild pain. Do not exceed 3000 mg in 24 hrs   aspirin EC 81 MG tablet Take 81 mg by mouth  daily.   Ativan 1 MG tablet Generic drug: LORazepam Take 1 mg by mouth every 8 (eight) hours as needed for anxiety.   atorvastatin 40 MG tablet Commonly known as: LIPITOR Take 40 mg by mouth at bedtime.   clopidogrel 75 MG tablet Commonly known as: PLAVIX Take 75 mg by mouth daily.   diphenhydrAMINE 25 mg capsule Commonly known as: BENADRYL Take 25 mg by mouth at bedtime as needed for itching.   Dulcolax  10 MG suppository Generic drug: bisacodyl Place 10 mg rectally daily as needed for moderate constipation, mild constipation or severe constipation.   ENSURE PO Take 120 mLs by mouth 2 (two) times daily. -8 ounces   fenofibrate 160 MG tablet Take 160 mg by mouth daily.   insulin lispro 100 UNIT/ML injection Commonly known as: HUMALOG Inject into the skin 3 (three) times daily before meals. Sliding scale   lidocaine 5 % Commonly known as: LIDODERM Place 1 patch onto the skin daily. Remove & Discard patch within 12 hours or as directed by MD @ 1800 Back pain   melatonin 3 MG Tabs tablet Take 3 mg by mouth at bedtime as needed (Sleep). q24   metoprolol succinate 25 MG 24 hr tablet Commonly known as: TOPROL-XL Take 12.5 mg by mouth daily.   OxyCODONE HCl (Abuse Deter) 5 MG Taba Commonly known as: OXAYDO Take 5 mg by mouth every 6 (six) hours as needed (Pain).   polyethylene glycol 17 g packet Commonly known as: MIRALAX / GLYCOLAX Take 17 g by mouth 2 (two) times daily.   pregabalin 150 MG capsule Commonly known as: LYRICA Take 150 mg by mouth 3 (three) times daily.   senna 8.6 MG Tabs tablet Commonly known as: SENOKOT Take 2 tablets by mouth 2 (two) times daily.   simethicone 80 MG chewable tablet Commonly known as: MYLICON Chew 80 mg by mouth 4 (four) times daily -  with meals and at bedtime.   sodium chloride 1 g tablet Take 1 g by mouth daily.   tamsulosin 0.4 MG Caps capsule Commonly known as: FLOMAX Take 0.4 mg by mouth daily.   traZODone 50 MG  tablet Commonly known as: DESYREL Take 50 mg by mouth at bedtime.   zinc gluconate 50 MG tablet Take 50 mg by mouth daily.               Durable Medical Equipment  (From admission, onward)           Start     Ordered   06/07/22 1453  DME Walker rolling  Once       Question Answer Comment  Walker: With [place]   Patient needs a walker to treat with the following condition Gait instability      06/07/22 1452   06/07/22 1453  DME 3 n 1  Once        06/07/22 1452            Disposition: home   Final Dx: posterior cervical decompression and fusion C3-C6  Discharge Instructions      Remove dressing in 72 hours   Complete by: As directed    Call MD for:  difficulty breathing, headache or visual disturbances   Complete by: As directed    Call MD for:  persistant nausea and vomiting   Complete by: As directed    Call MD for:  redness, tenderness, or signs of infection (pain, swelling, redness, odor or green/yellow discharge around incision site)   Complete by: As directed    Call MD for:  severe uncontrolled pain   Complete by: As directed    Call MD for:  temperature >100.4   Complete by: As directed    Diet - low sodium heart healthy   Complete by: As directed    Increase activity slowly   Complete by: As directed         Follow-up Information     Eustace Moore, MD.  Schedule an appointment as soon as possible for a visit in 2 week(s).   Specialty: Neurosurgery Contact information: [address] [place] [address] [place] [address] [phone]                  Signed: Ocie Cornfield [facility] 06/10/2022, 8:06 AM

## 2022-08-24 ENCOUNTER — Ambulatory Visit (INDEPENDENT_AMBULATORY_CARE_PROVIDER_SITE_OTHER): Payer: Medicare Other | Admitting: Gastroenterology

## 2022-08-24 ENCOUNTER — Encounter (INDEPENDENT_AMBULATORY_CARE_PROVIDER_SITE_OTHER): Payer: Self-pay | Admitting: Gastroenterology

## 2022-08-24 VITALS — BP 134/81 | HR 77 | Temp 97.8°F | Ht 69.0 in | Wt 213.0 lb

## 2022-08-24 DIAGNOSIS — K5900 Constipation, unspecified: Secondary | ICD-10-CM | POA: Diagnosis not present

## 2022-08-24 NOTE — Patient Instructions (Signed)
Increase water intake, aim for atleast 64 oz per day Increase fruits, veggies and whole grains, kiwi and prunes are especially good for constipation You can continue to use miralax 1 capful as needed for constipation, this can be obtained over the counter  Will plan to see you as needed, please let me know if you have new or worsening GI issues  It was a pleasure to see you today. I want to create trusting relationships with patients and provide genuine, compassionate, and quality care. I truly value your feedback! please be on the lookout for a survey regarding your visit with me today. I appreciate your input about our visit and your time in completing this!    Kitt Ledet L. Alver Sorrow, MSN, APRN, AGNP-C Adult-Gerontology Nurse Practitioner [facility] at [facility]

## 2022-08-24 NOTE — Progress Notes (Signed)
Referring Provider: Moss Mc, NP Primary Care Physician:  Moss Mc, NP Primary GI Physician: Jenetta Downer   Chief Complaint  Patient presents with   Constipation    Patient arrives with Curtis Hansen from [place]. Follow up on constipation. Reports much better and not taking any meds for constipation currently. Having 1 -3 stools per day. Does have some concerns about gas.    HPI:   Curtis Hansen is a 66 y.o. male with past medical history of anxiety, DM, HLD, HTN, OSA, spinal stenosis   Patient presenting today for follow up of constipation.  Last seen September 2023, at that time having constipation and more gas, going as long as a week without a BM. Some lower abdominal pain that improves with defecation.   Recommend starting miralax, schedule colonoscopy, dulcolax suppository PRN, consider Movantik if no improvement with miralax, increase water and fiber.  Present:  Patient states constipation has resolved. He states that he had recent neck surgery and resumed his miralax for a few days with good results in hopes of avoiding any constipation. He is taking oxycodone only PRN for severe pain. He is not taking anything for constipation currently. He is having 1-3 BMs per day. He is not having any abdominal pain, reports some gas at times and is not getting his gas pills/gas x on a regular basis at the nursing facility. When he takes it, this seems to help. Denies any rectal bleeding or melena. No changes in appetite or weight loss.  No nausea or vomiting. Overall doing well from GI standpoint. Has upcoming appt to discuss pain stimulator for his back.    Last Colonoscopy:03/2022 - Hemorrhoids found on perianal exam.                           - One 4 mm polyp in the cecum, removed with a cold                            snare. Resected and retrieved. Clip was placed.                            Clip manufacturer: [employer].                           - Four 4 to 6 mm polyps  in the descending colon, in                            the transverse colon and in the ascending colon,                            removed with a cold snare. Resected and retrieved.                           - One 12 mm polyp in the proximal transverse colon,                            removed with a hot snare. Resected and retrieved.  Clips were placed. Clip manufacturer: [employer].                           - Diverticulosis in the sigmoid colon.                           - Melanosis in the colon.                           - The distal rectum and anal verge are normal on                            retroflexion view.(5 TAs)  Last Endoscopy: never  Recommendations:  Repeat TCS 3 years   Past Medical History:  Diagnosis Date   Abnormality of gait    Anxiety    Cognitive communication deficit    Diabetes ([place])    Gangrene ([place])    Hyperlipemia    Hypertension    Multiple fractures    right ribs   OSA (obstructive sleep apnea)    Repeated falls    Spinal stenosis     Past Surgical History:  Procedure Laterality Date   COLONOSCOPY WITH PROPOFOL N/A 04/06/2022   Procedure: COLONOSCOPY WITH PROPOFOL;  Surgeon: Harvel Quale, MD;  [facility];  Service: Gastroenterology;  Laterality: N/A;  10:00 asa 2 PATIENT IS IN [facility]   HEMOSTASIS CLIP PLACEMENT  04/06/2022   Procedure: HEMOSTASIS CLIP PLACEMENT;  Surgeon: Harvel Quale, MD;  Location: [facility];  Service: Gastroenterology;;   POLYPECTOMY  04/06/2022   Procedure: POLYPECTOMY;  Surgeon: Harvel Quale, MD;  Location: [facility];  Service: Gastroenterology;;   POSTERIOR CERVICAL FUSION/FORAMINOTOMY N/A 06/07/2022   Procedure: Posterior cervical fusion with lateral mass fixation - Cervical three - Cervical six, laminectomy  Cervical three-five;  Surgeon: Eustace Moore, MD;  Location: [place];  Service:  Neurosurgery;  Laterality: N/A;    Current Outpatient Medications  Medication Sig Dispense Refill   acetaminophen (TYLENOL) 325 MG tablet Take 650 mg by mouth every 6 (six) hours as needed for mild pain. Do not exceed 3000 mg in 24 hrs     aspirin EC 81 MG tablet Take 81 mg by mouth daily.     atorvastatin (LIPITOR) 40 MG tablet Take 40 mg by mouth at bedtime.     bisacodyl (DULCOLAX) 10 MG suppository Place 10 mg rectally daily as needed for moderate constipation, mild constipation or severe constipation.     clopidogrel (PLAVIX) 75 MG tablet Take 75 mg by mouth daily.     diphenhydrAMINE (BENADRYL) 25 mg capsule Take 25 mg by mouth at bedtime as needed for itching.     fenofibrate 160 MG tablet Take 160 mg by mouth daily.     melatonin 3 MG TABS tablet Take 3 mg by mouth at bedtime as needed (Sleep). q24     metoprolol succinate (TOPROL-XL) 25 MG 24 hr tablet Take 12.5 mg by mouth daily.     OxyCODONE HCl, Abuse Deter, (OXAYDO) 5 MG TABA Take 5 mg by mouth every 6 (six) hours as needed (Pain). 40 tablet 0   polyethylene glycol (MIRALAX /  GLYCOLAX) 17 g packet Take 17 g by mouth 2 (two) times daily.     senna (SENOKOT) 8.6 MG TABS tablet Take 2 tablets by mouth 2 (two) times daily.     simethicone (MYLICON) 80 MG chewable tablet Chew 80 mg by mouth 4 (four) times daily -  with meals and at bedtime.     sodium chloride 1 g tablet Take 1 g by mouth daily.     tamsulosin (FLOMAX) 0.4 MG CAPS capsule Take 0.4 mg by mouth daily.     traZODone (DESYREL) 50 MG tablet Take 50 mg by mouth at bedtime.     pregabalin (LYRICA) 150 MG capsule Take 150 mg by mouth 3 (three) times daily.     No current facility-administered medications for this visit.    Allergies as of 08/24/2022 - Review Complete 08/24/2022  Allergen Reaction Noted   Rosuvastatin Nausea Only 12/19/2009   Grass pollen(k-o-r-t-swt vern) Cough 01/05/2021   Celecoxib Other (See Comments) 11/19/2021    No family history on  file.  Social History   Socioeconomic History   Marital status: Single    Spouse name: Not on file   Number of children: Not on file   Years of education: Not on file   Highest education level: Not on file  Occupational History   Not on file  Tobacco Use   Smoking status: Never    Passive exposure: Never   Smokeless tobacco: Never  Vaping Use   Vaping Use: Never used  Substance and Sexual Activity   Alcohol use: Never   Drug use: Never   Sexual activity: Not on file  Other Topics Concern   Not on file  Social History Narrative   Not on file   Social Determinants of Health   Financial Resource Strain: Not on file  Food Insecurity: No Food Insecurity (06/07/2022)   Hunger Vital Sign    Worried About Running Out of Food in the Last Year: Never true    Ran Out of Food in the Last Year: Never true  Transportation Needs: No Transportation Needs (06/07/2022)   PRAPARE - Hydrologist (Medical): No    Lack of Transportation (Non-Medical): No  Physical Activity: Not on file  Stress: Not on file  Social Connections: Not on file    Review of systems General: negative for malaise, night sweats, fever, chills, weight loss Neck: Negative for lumps, goiter, pain and significant neck swelling Resp: Negative for cough, wheezing, dyspnea at rest CV: Negative for chest pain, leg swelling, palpitations, orthopnea GI: denies melena, hematochezia, nausea, vomiting, diarrhea, constipation, dysphagia, odyonophagia, early satiety or unintentional weight loss.  MSK: Negative for joint pain or swelling, back pain, and muscle pain. Derm: Negative for itching or rash Psych: Denies depression, anxiety, memory loss, confusion. No homicidal or suicidal ideation.  Heme: Negative for prolonged bleeding, bruising easily, and swollen nodes. Endocrine: Negative for cold or heat intolerance, polyuria, polydipsia and goiter. Neuro: negative for tremor, gait imbalance, syncope  and seizures. The remainder of the review of systems is noncontributory.  Physical Exam: BP 134/81   Pulse 77   Temp 97.8 F (36.6 C) (Oral)   Ht 5\' 9"  (1.753 m)   Wt 213 lb (96.6 kg) Comment: per pateint on march 1st  BMI 31.45 kg/m  General:   Alert and oriented. No distress noted. Pleasant and cooperative. In wheelchair  Head:  Normocephalic and atraumatic. Eyes:  Conjuctiva clear without scleral icterus. Mouth:  Oral  mucosa pink and moist. Good dentition. No lesions. Heart: Normal rate and rhythm, s1 and s2 heart sounds present.  Lungs: Clear lung sounds in all lobes. Respirations equal and unlabored. Abdomen:  +BS, soft, non-tender and non-distended. No rebound or guarding. No HSM or masses noted. Derm: No palmar erythema or jaundice Msk:  Symmetrical without gross deformities. Normal posture. Extremities:  Without edema. Neurologic:  Alert and  oriented x4 Psych:  Alert and cooperative. Normal mood and affect.  Invalid input(s): "6 MONTHS"   ASSESSMENT: RAMAR GRASS is a 67 y.o. male presenting today for follow up of constipation.   Constipation: has resolved. Not currently on a bowel regimen. Having 1-3 BMs per day without issue. Encouraged to use miralax PRN for constipation and Increase water intake, aim for atleast 64 oz per day Increase fruits, veggies and whole grains, kiwi and prunes are especially good for constipation. Suspect previous issues with constipation could have been secondary to ongoing opiate pain medication use for chronic pain. He is now only using this PRN for severe pain. He will make me aware if he has new or worsening GI symptoms.     PLAN:  Continue to use miralax PRN  2. Increase water intake, aim for atleast 64 oz per day Increase fruits, veggies and whole grains, kiwi and prunes are especially good for constipation  All questions were answered, patient verbalized understanding and is in agreement with plan as outlined above.   Follow  Up: PRN  Curtis Hansen L. Alver Sorrow, MSN, APRN, AGNP-C Adult-Gerontology Nurse Practitioner [facility]  I have reviewed the note and agree with the APP's assessment as described in this progress note  Maylon Peppers, MD Gastroenterology and Hepatology [facility]

## 2022-08-26 NOTE — Addendum Note (Signed)
Addended by: Harvel Quale on: 08/26/2022 11:12 PM   Modules accepted: Level of Service

## 2022-11-29 ENCOUNTER — Other Ambulatory Visit (HOSPITAL_COMMUNITY): Payer: Self-pay | Admitting: Sports Medicine

## 2022-11-29 DIAGNOSIS — M25511 Pain in right shoulder: Secondary | ICD-10-CM

## 2022-12-22 ENCOUNTER — Ambulatory Visit (HOSPITAL_COMMUNITY)
Admission: RE | Admit: 2022-12-22 | Discharge: 2022-12-22 | Disposition: A | Discharge status: Home / Self Care | Payer: Medicare Other | Source: Ambulatory Visit | Attending: Sports Medicine | Admitting: Sports Medicine

## 2022-12-22 DIAGNOSIS — M25511 Pain in right shoulder: Secondary | ICD-10-CM | POA: Diagnosis present

## 2023-09-27 ENCOUNTER — Other Ambulatory Visit (HOSPITAL_COMMUNITY): Payer: Self-pay | Admitting: Pain Medicine

## 2023-09-27 DIAGNOSIS — M549 Dorsalgia, unspecified: Secondary | ICD-10-CM

## 2023-09-27 DIAGNOSIS — G8929 Other chronic pain: Secondary | ICD-10-CM

## 2023-10-04 ENCOUNTER — Ambulatory Visit (HOSPITAL_COMMUNITY)
Admission: RE | Admit: 2023-10-04 | Discharge: 2023-10-04 | Disposition: A | Discharge status: Home / Self Care | Source: Ambulatory Visit | Attending: Pain Medicine | Admitting: Pain Medicine

## 2023-10-04 DIAGNOSIS — M549 Dorsalgia, unspecified: Secondary | ICD-10-CM | POA: Insufficient documentation

## 2023-10-04 DIAGNOSIS — G8929 Other chronic pain: Secondary | ICD-10-CM | POA: Insufficient documentation

## 2023-10-04 DIAGNOSIS — M545 Low back pain, unspecified: Secondary | ICD-10-CM

## 2023-10-30 IMAGING — DX DG HIP (WITH OR WITHOUT PELVIS) 2-3V*R*
3 series · 3 of 3 positions shown · non-contrast
Comparison: None Available.

CLINICAL DATA: Wound right hip

EXAM:
DG HIP (WITH OR WITHOUT PELVIS) 2-3V RIGHT

[pelvis ap]
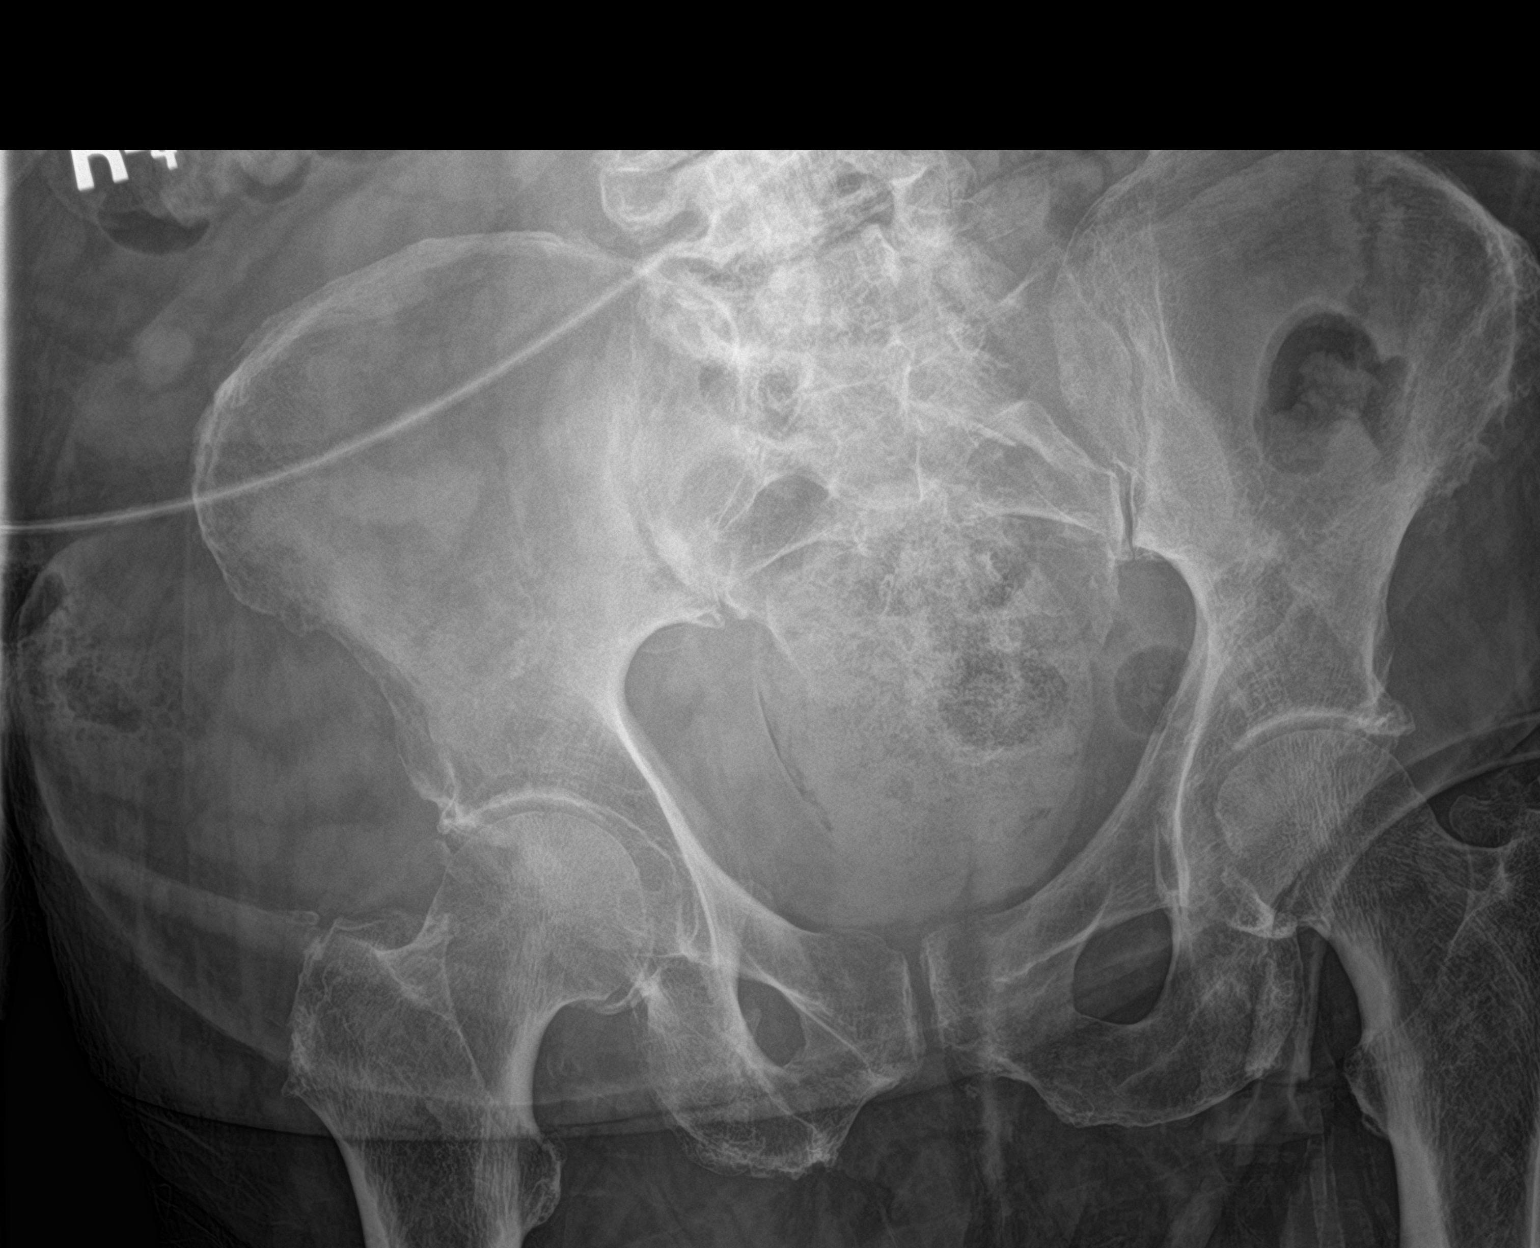

[hip ap]
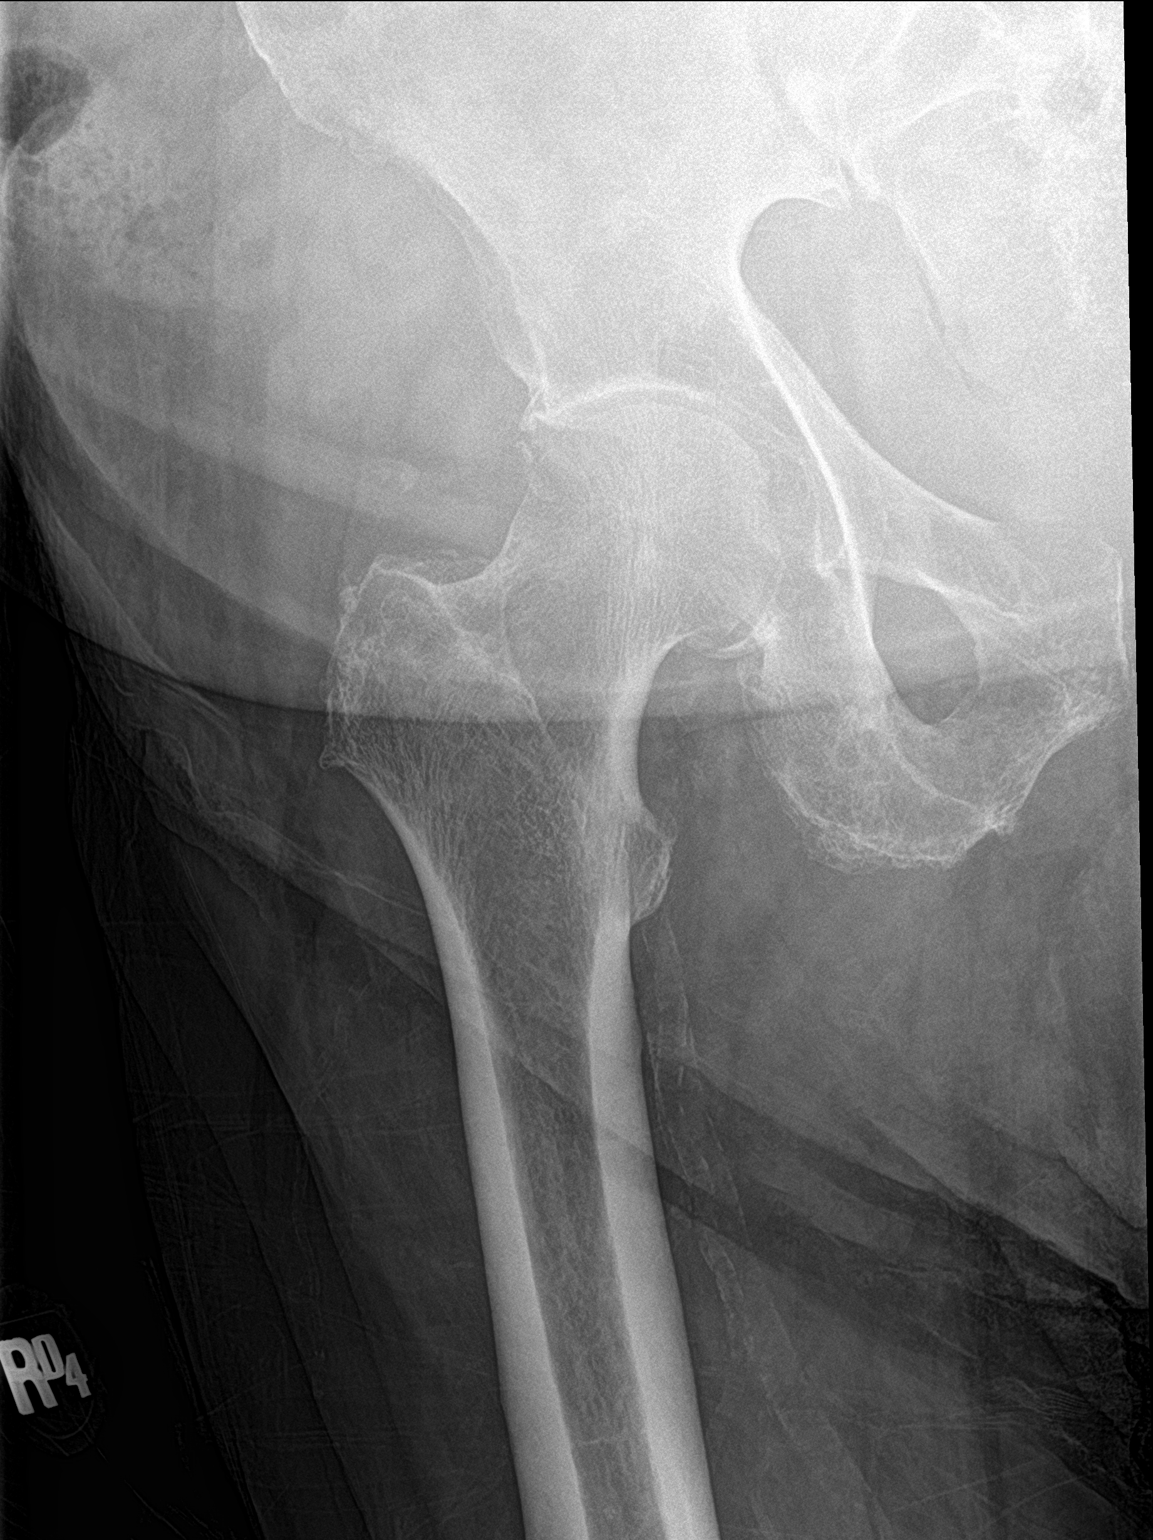

[hip lat]
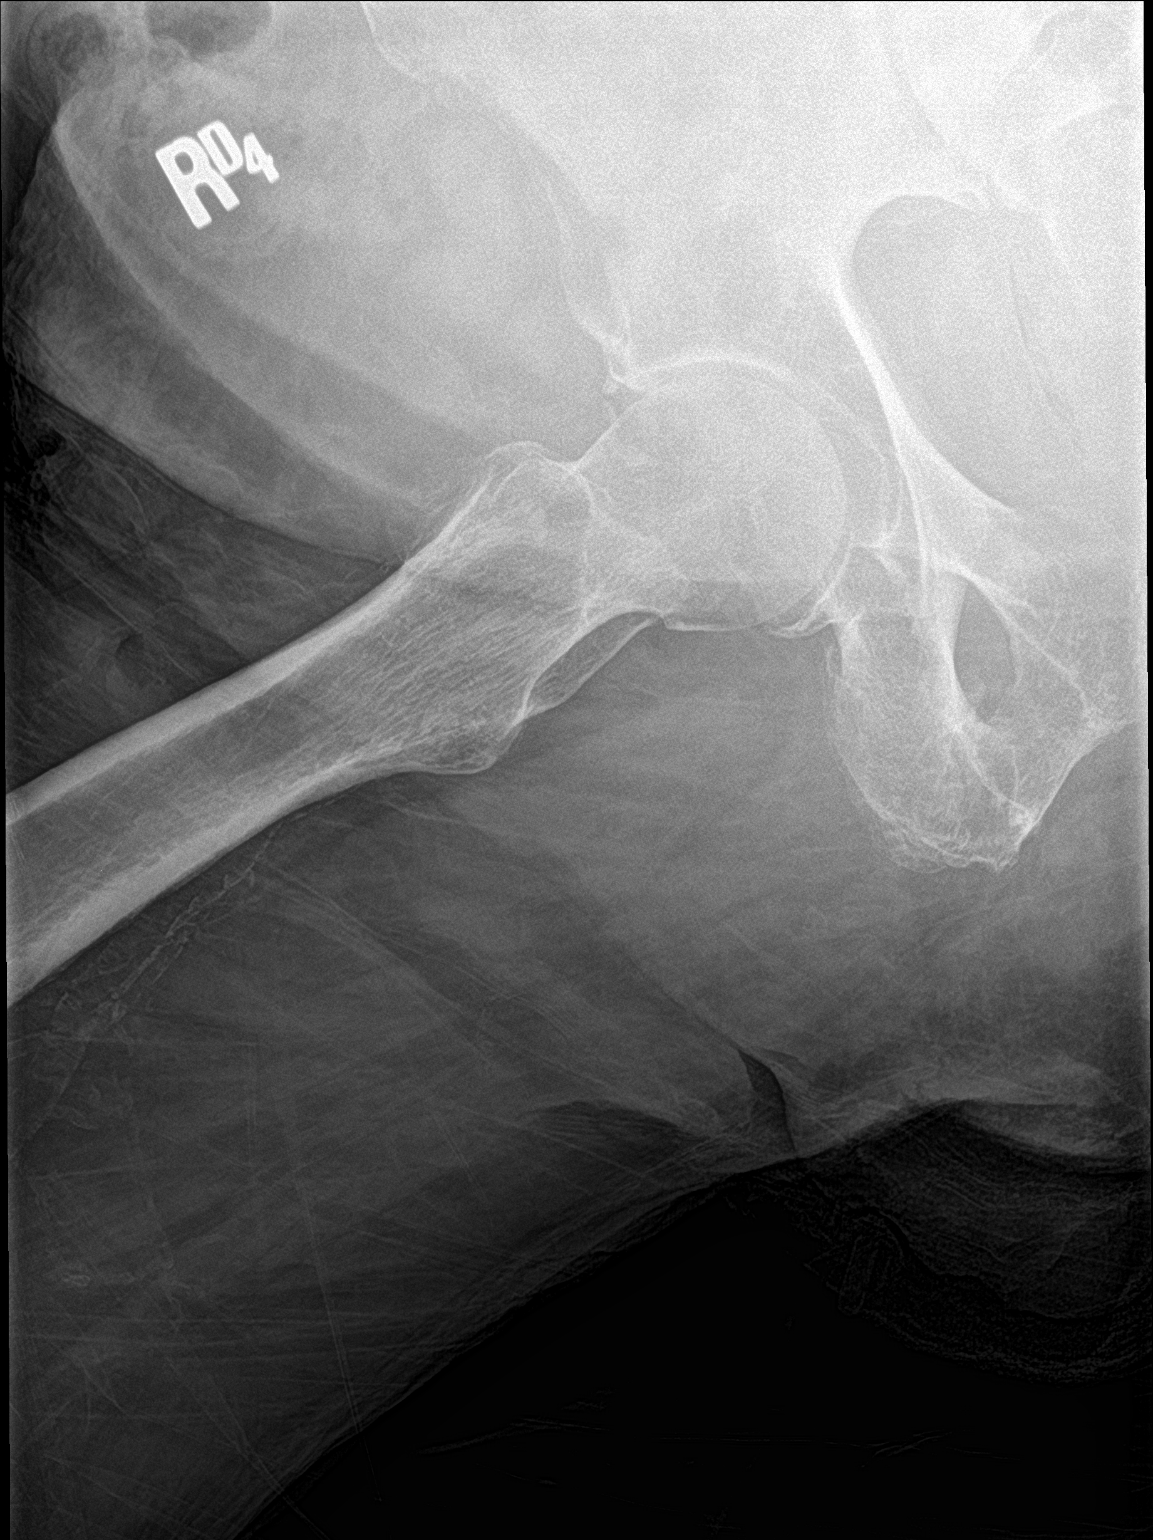

[3 of 3 positions shown; findings below may reference images not displayed]

FINDINGS: There is no evidence of hip fracture or dislocation. There is no
evidence of arthropathy or other focal bone abnormality. Lumbar
scoliosis and spondylosis.

Gas bubbles in the soft tissues right lateral buttock region likely
due to soft tissue infection.
IMPRESSION: No acute skeletal abnormality

Gas bubbles in the soft tissues lateral to the right iliac bone may
be due to soft tissue infection.

## 2023-12-05 ENCOUNTER — Ambulatory Visit (INDEPENDENT_AMBULATORY_CARE_PROVIDER_SITE_OTHER): Admitting: Orthopaedic Surgery

## 2023-12-05 ENCOUNTER — Other Ambulatory Visit (INDEPENDENT_AMBULATORY_CARE_PROVIDER_SITE_OTHER): Payer: Self-pay

## 2023-12-05 VITALS — Ht 69.0 in | Wt 251.0 lb

## 2023-12-05 DIAGNOSIS — M25561 Pain in right knee: Secondary | ICD-10-CM

## 2023-12-05 DIAGNOSIS — E1169 Type 2 diabetes mellitus with other specified complication: Secondary | ICD-10-CM | POA: Diagnosis not present

## 2023-12-05 DIAGNOSIS — G8929 Other chronic pain: Secondary | ICD-10-CM | POA: Diagnosis not present

## 2023-12-05 DIAGNOSIS — M1711 Unilateral primary osteoarthritis, right knee: Secondary | ICD-10-CM | POA: Insufficient documentation

## 2023-12-05 LAB — POCT GLYCOSYLATED HEMOGLOBIN (HGB A1C)

## 2023-12-05 NOTE — Addendum Note (Signed)
 Addended by: EILLEEN ROSINA HERO on: 12/05/2023 02:59 PM   Modules accepted: Orders

## 2023-12-05 NOTE — Progress Notes (Signed)
 The patient is a 68 year old gentleman that I am seeing for today as a relates to chronic knee pain and known bone-on-bone wear with osteoarthritis of the right knee.  He does have a remote history of a left knee replacement.  He also has a remote history of a right knee quad tendon rupture that required a repair.  He gets around minimally a lot of times of the wheelchair.  This is due to spine issues as well.  He is a diabetic.  His blood glucose is running just a little bit high recently.  He is staying in a skilled nursing facility as a rehab for his back and due to his limited mobility as a relates to his right knee.  His BMI is 37.07.  He is on a baby aspirin and Plavix daily.  He can come off Plavix for a week prior to any type of procedure.  I was able to review all of his past medical history and medications within epic.  Examination of his right knee does show weakness with his quads but he can extend his knee.  There is atrophy of the quad muscles and around the quad tendon from previous surgery with a well-healed midline surgical incision.  He has painful arc of motion of the right knee.  X-rays right knee show complete loss of joint space.  The bone is osteopenic but there is complete bone-on-bone wear of the medial and lateral compartments and patellofemoral joint.  He does have a lot of crepitation on exam coming from the patella with flexion and extension.  I did show him a knee replacement model and he is fully aware what the surgery involves.  He knows that the surgery will be somewhat difficult given his quad tendon issues and given the pain he has been experiencing.  We do need to check a hemoglobin A1c in the office today and we can then work on getting him scheduled in the future for right total knee replacement.  I did show him a knee replacement model and went over his x-rays with him.  We discussed the risks and benefits of the surgery and what to expect from an intraoperative and  postoperative standpoint.  All questions and concerns were answered.  His hemoglobin A1c in the office today was 7.0.  We can work on getting him scheduled for an knee replacement.

## 2023-12-27 ENCOUNTER — Other Ambulatory Visit: Payer: Self-pay | Admitting: Physician Assistant

## 2023-12-27 DIAGNOSIS — Z01818 Encounter for other preprocedural examination: Secondary | ICD-10-CM

## 2024-01-03 NOTE — Patient Instructions (Signed)
 SURGICAL WAITING ROOM VISITATION  Patients having surgery or a procedure may have no more than 2 support people in the waiting area - these visitors may rotate.    Children under the age of 86 must have an adult with them who is not the patient.  Visitors with respiratory illnesses are discouraged from visiting and should remain at home.  If the patient needs to stay at the hospital during part of their recovery, the visitor guidelines for inpatient rooms apply. Pre-op nurse will coordinate an appropriate time for 1 support person to accompany patient in pre-op.  This support person may not rotate.    Please refer to the [facility] website for the visitor guidelines for Inpatients (after your surgery is over and you are in a regular room).       Your procedure is scheduled on: 01/13/24   Report to [facility] Main Entrance    Report to admitting at  8:45 AM   Call this number if you have problems the morning of surgery [phone]   Do not eat food :After Midnight.   After Midnight you may have the following liquids until 8:15 AM DAY OF SURGERY  Water Non-Citrus Juices (without pulp, NO RED-Apple, White grape, White cranberry) Black Coffee (NO MILK/CREAM OR CREAMERS, sugar ok)  Clear Tea (NO MILK/CREAM OR CREAMERS, sugar ok) regular and decaf                             Plain Jell-O (NO RED)                                           Fruit ices (not with fruit pulp, NO RED)                                     Popsicles (NO RED)                                                               Sports drinks like Gatorade (NO RED)                The day of surgery:  Drink ONE (1) Pre-Surgery  or G2 at 8:15 AM the morning of surgery. Drink in one sitting. Do not sip.  This drink was given to you during your hospital  pre-op appointment visit. Nothing else to drink after completing the  Pre-Surgery G2.          If you have questions, please contact your surgeon's  office.     Oral Hygiene is also important to reduce your risk of infection.                                    Remember - BRUSH YOUR TEETH THE MORNING OF SURGERY WITH YOUR REGULAR TOOTHPASTE  DENTURES WILL BE REMOVED PRIOR TO SURGERY PLEASE DO NOT APPLY Poly grip OR ADHESIVES!!!   Do NOT smoke after Midnight   Stop all vitamins and  herbal supplements 7 days before surgery.   Take these medicines the morning of surgery with A SIP OF WATER: Atorvastatin, Fenofibrate , metoprolol , Lyrica (pregabalin ), Tylenol  if needed.  DO NOT TAKE ANY ORAL DIABETIC MEDICATIONS DAY OF YOUR SURGERY  Bring CPAP mask and tubing day of surgery.                              You may not have any metal on your body including hair pins, jewelry, and body piercing             Do not wear make-up, lotions, powders, perfumes/cologne, or deodorant              Men may shave face and neck.   Do not bring valuables to the hospital. [facility] IS NOT             RESPONSIBLE   FOR VALUABLES.   Contacts, glasses, dentures or bridgework may not be worn into surgery.   Bring small overnight bag day of surgery.   DO NOT BRING YOUR HOME MEDICATIONS TO THE HOSPITAL. PHARMACY WILL DISPENSE MEDICATIONS LISTED ON YOUR MEDICATION LIST TO YOU DURING YOUR ADMISSION IN THE HOSPITAL!    Patients discharged on the day of surgery will not be allowed to drive home.  Someone NEEDS to stay with you for the first 24 hours after anesthesia.   Special Instructions: Bring a copy of your healthcare power of attorney and living will documents the day of surgery if you haven't scanned them before.              Please read over the following fact sheets you were given: IF YOU HAVE QUESTIONS ABOUT YOUR PRE-OP INSTRUCTIONS PLEASE CALL [phone] Curtis Hansen   If you received a COVID test during your pre-op visit  it is requested that you wear a mask when out in public, stay away from anyone that may not be feeling well and notify your surgeon  if you develop symptoms. If you test positive for Covid or have been in contact with anyone that has tested positive in the last 10 days please notify you surgeon.      Pre-operative 5 CHG Bath Instructions   You can play a key role in reducing the risk of infection after surgery. Your skin needs to be as free of germs as possible. You can reduce the number of germs on your skin by washing with CHG (chlorhexidine  gluconate) soap before surgery. CHG is an antiseptic soap that kills germs and continues to kill germs even after washing.   DO NOT use if you have an allergy to chlorhexidine /CHG or antibacterial soaps. If your skin becomes reddened or irritated, stop using the CHG and notify one of our RNs at [phone].   Please shower with the CHG soap starting 4 days before surgery using the following schedule:     Please keep in mind the following:  DO NOT shave, including legs and underarms, starting the day of your first shower.   You may shave your face at any point before/day of surgery.  Place clean sheets on your bed the day you start using CHG soap. Use a clean washcloth (not used since being washed) for each shower. DO NOT sleep with pets once you start using the CHG.   CHG Shower Instructions:  If you choose to wash your hair and private area, wash first with your normal shampoo/soap.  After you use  shampoo/soap, rinse your hair and body thoroughly to remove shampoo/soap residue.  Turn the water OFF and apply about 3 tablespoons (45 ml) of CHG soap to a CLEAN washcloth.  Apply CHG soap ONLY FROM YOUR NECK DOWN TO YOUR TOES (washing for 3-5 minutes)  DO NOT use CHG soap on face, private areas, open wounds, or sores.  Pay special attention to the area where your surgery is being performed.  If you are having back surgery, having someone wash your back for you may be helpful. Wait 2 minutes after CHG soap is applied, then you may rinse off the CHG soap.  Pat dry with a clean towel   Put on clean clothes/pajamas   If you choose to wear lotion, please use ONLY the CHG-compatible lotions on the back of this paper.     Additional instructions for the day of surgery: DO NOT APPLY any lotions, deodorants, cologne, or perfumes.   Put on clean/comfortable clothes.  Brush your teeth.  Ask your nurse before applying any prescription medications to the skin.      CHG Compatible Lotions   Aveeno Moisturizing lotion  Cetaphil Moisturizing Cream  Cetaphil Moisturizing Lotion  Clairol Herbal Essence Moisturizing Lotion, Dry Skin  Clairol Herbal Essence Moisturizing Lotion, Extra Dry Skin  Clairol Herbal Essence Moisturizing Lotion, Normal Skin  Curel Age Defying Therapeutic Moisturizing Lotion with Alpha Hydroxy  Curel Extreme Care Body Lotion  Curel Soothing Hands Moisturizing Hand Lotion  Curel Therapeutic Moisturizing Cream, Fragrance-Free  Curel Therapeutic Moisturizing Lotion, Fragrance-Free  Curel Therapeutic Moisturizing Lotion, Original Formula  Eucerin Daily Replenishing Lotion  Eucerin Dry Skin Therapy Plus Alpha Hydroxy Crme  Eucerin Dry Skin Therapy Plus Alpha Hydroxy Lotion  Eucerin Original Crme  Eucerin Original Lotion  Eucerin Plus Crme Eucerin Plus Lotion  Eucerin TriLipid Replenishing Lotion  Keri Anti-Bacterial Hand Lotion  Keri Deep Conditioning Original Lotion Dry Skin Formula Softly Scented  Keri Deep Conditioning Original Lotion, Fragrance Free Sensitive Skin Formula  Keri Lotion Fast Absorbing Fragrance Free Sensitive Skin Formula  Keri Lotion Fast Absorbing Softly Scented Dry Skin Formula  Keri Original Lotion  Keri Skin Renewal Lotion Keri Silky Smooth Lotion  Keri Silky Smooth Sensitive Skin Lotion  Nivea Body Creamy Conditioning Oil  Nivea Body Extra Enriched Lotion  Nivea Body Original Lotion  Nivea Body Sheer Moisturizing Lotion Nivea Crme  Nivea Skin Firming Lotion  NutraDerm 30 Skin Lotion  NutraDerm Skin Lotion  NutraDerm  Therapeutic Skin Cream  NutraDerm Therapeutic Skin Lotion  ProShield Protective Hand Cream   Incentive Spirometer  An incentive spirometer is a tool that can help keep your lungs clear and active. This tool measures how well you are filling your lungs with each breath. Taking long deep breaths may help reverse or decrease the chance of developing breathing (pulmonary) problems (especially infection) following: A long period of time when you are unable to move or be active. BEFORE THE PROCEDURE  If the spirometer includes an indicator to show your best effort, your nurse or respiratory therapist will set it to a desired goal. If possible, sit up straight or lean slightly forward. Try not to slouch. Hold the incentive spirometer in an upright position. INSTRUCTIONS FOR USE  Sit on the edge of your bed if possible, or sit up as far as you can in bed or on a chair. Hold the incentive spirometer in an upright position. Breathe out normally. Place the mouthpiece in your mouth and seal your lips tightly around it. Breathe  in slowly and as deeply as possible, raising the piston or the ball toward the top of the column. Hold your breath for 3-5 seconds or for as long as possible. Allow the piston or ball to fall to the bottom of the column. Remove the mouthpiece from your mouth and breathe out normally. Rest for a few seconds and repeat Steps 1 through 7 at least 10 times every 1-2 hours when you are awake. Take your time and take a few normal breaths between deep breaths. The spirometer may include an indicator to show your best effort. Use the indicator as a goal to work toward during each repetition. After each set of 10 deep breaths, practice coughing to be sure your lungs are clear. If you have an incision (the cut made at the time of surgery), support your incision when coughing by placing a pillow or rolled up towels firmly against it. Once you are able to get out of bed, walk around indoors and  cough well. You may stop using the incentive spirometer when instructed by your caregiver.  RISKS AND COMPLICATIONS Take your time so you do not get dizzy or light-headed. If you are in pain, you may need to take or ask for pain medication before doing incentive spirometry. It is harder to take a deep breath if you are having pain. AFTER USE Rest and breathe slowly and easily. It can be helpful to keep track of a log of your progress. Your caregiver can provide you with a simple table to help with this. If you are using the spirometer at home, follow these instructions: SEEK MEDICAL CARE IF:  You are having difficultly using the spirometer. You have trouble using the spirometer as often as instructed. Your pain medication is not giving enough relief while using the spirometer. You develop fever of 100.5 F (38.1 C) or higher. SEEK IMMEDIATE MEDICAL CARE IF:  You cough up bloody sputum that had not been present before. You develop fever of 102 F (38.9 C) or greater. You develop worsening pain at or near the incision site. MAKE SURE YOU:  Understand these instructions. Will watch your condition. Will get help right away if you are not doing well or get worse. Document Released: 09/27/2006 Document Revised: 08/09/2011 Document Reviewed: 11/28/2006  WHAT IS A BLOOD TRANSFUSION? Blood Transfusion Information  A transfusion is the replacement of blood or some of its parts. Blood is made up of multiple cells which provide different functions. Red blood cells carry oxygen and are used for blood loss replacement. White blood cells fight against infection. Platelets control bleeding. Plasma helps clot blood. Other blood products are available for specialized needs, such as hemophilia or other clotting disorders. BEFORE THE TRANSFUSION  Who gives blood for transfusions?  Healthy volunteers who are fully evaluated to make sure their blood is safe. This is blood bank blood. Transfusion  therapy is the safest it has ever been in the practice of medicine. Before blood is taken from a donor, a complete history is taken to make sure that person has no history of diseases nor engages in risky social behavior (examples are intravenous drug use or sexual activity with multiple partners). The donor's travel history is screened to minimize risk of transmitting infections, such as malaria. The donated blood is tested for signs of infectious diseases, such as HIV and hepatitis. The blood is then tested to be sure it is compatible with you in order to minimize the chance of a transfusion reaction. If  you or a relative donates blood, this is often done in anticipation of surgery and is not appropriate for emergency situations. It takes many days to process the donated blood. RISKS AND COMPLICATIONS Although transfusion therapy is very safe and saves many lives, the main dangers of transfusion include:  Getting an infectious disease. Developing a transfusion reaction. This is an allergic reaction to something in the blood you were given. Every precaution is taken to prevent this. The decision to have a blood transfusion has been considered carefully by your caregiver before blood is given. Blood is not given unless the benefits outweigh the risks. AFTER THE TRANSFUSION Right after receiving a blood transfusion, you will usually feel much better and more energetic. This is especially true if your red blood cells have gotten low (anemic). The transfusion raises the level of the red blood cells which carry oxygen, and this usually causes an energy increase. The nurse administering the transfusion will monitor you carefully for complications. HOME CARE INSTRUCTIONS  No special instructions are needed after a transfusion. You may find your energy is better. Speak with your caregiver about any limitations on activity for underlying diseases you may have. SEEK MEDICAL CARE IF:  Your condition is not  improving after your transfusion. You develop redness or irritation at the intravenous (IV) site. SEEK IMMEDIATE MEDICAL CARE IF:  Any of the following symptoms occur over the next 12 hours: Shaking chills. You have a temperature by mouth above 102 F (38.9 C), not controlled by medicine. Chest, back, or muscle pain. People around you feel you are not acting correctly or are confused. Shortness of breath or difficulty breathing. Dizziness and fainting. You get a rash or develop hives. You have a decrease in urine output. Your urine turns a dark color or changes to pink, red, or brown. Any of the following symptoms occur over the next 10 days: You have a temperature by mouth above 102 F (38.9 C), not controlled by medicine. Shortness of breath. Weakness after normal activity. The white part of the eye turns yellow (jaundice). You have a decrease in the amount of urine or are urinating less often. Your urine turns a dark color or changes to pink, red, or brown. Document Released: 05/14/2000 Document Revised: 08/09/2011 Document Reviewed: 01/01/2008 [facility] Patient Information 2014 [place], [place].

## 2024-01-03 NOTE — Progress Notes (Signed)
 COVID Vaccine received:  []  No [x]  Yes Date of any COVID positive Test in last 90 days: no PCP - Velinda Senegal MD Cardiologist - n/a  Chest x-ray -  EKG -   Stress Test -  ECHO -  Cardiac Cath -   Bowel Prep - [x]  No  []   Yes ______  Pacemaker / ICD device [x]  No []  Yes   Spinal Cord Stimulator:[x]  No []  Yes       History of Sleep Apnea? [x]  No []  Yes   CPAP used?- [x]  No []  Yes    Does the patient monitor blood sugar?          []  No [x]  Yes  []  N/A  Patient has: []  NO Hx DM   []  Pre-DM                 []  DM1  [x]   DM2 Does patient have a [employer] or Dexacom? []  No [x]  Yes   Fasting Blood Sugar Ranges- 140's Checks Blood Sugar __4___ times a day  GLP1 agonist / usual dose - no GLP1 instructions:  SGLT-2 inhibitors / usual dose - no SGLT-2 instructions:   Blood Thinner / Instructions:Plavix daily-  Last dose 01/05/24 Will hold now Aspirin Instructions:Can continue ASA throughout.  Comments:   Activity level: Patient is  unable to climb a flight of stairs without difficulty; []  No CP  [x]  No SOB, but would have _knee dysfunction__   Patient can not perform ADLs without assistance.   Anesthesia review: HTN, DM, Abnl. EKG, PAD,PVD, On Plavix  Patient denies shortness of breath, fever, cough and chest pain at PAT appointment.  Patient verbalized understanding and agreement to the Pre-Surgical Instructions that were given to them at this PAT appointment. Patient was also educated of the need to review these PAT instructions again prior to his/her surgery.I reviewed the appropriate phone numbers to call if they have any and questions or concerns.

## 2024-01-05 ENCOUNTER — Encounter (HOSPITAL_COMMUNITY): Payer: Self-pay

## 2024-01-05 ENCOUNTER — Telehealth: Payer: Self-pay | Admitting: Orthopaedic Surgery

## 2024-01-05 ENCOUNTER — Encounter (HOSPITAL_COMMUNITY)
Admission: RE | Admit: 2024-01-05 | Discharge: 2024-01-05 | Disposition: A | Discharge status: Home / Self Care | Source: Ambulatory Visit | Attending: Orthopaedic Surgery | Admitting: Orthopaedic Surgery

## 2024-01-05 ENCOUNTER — Other Ambulatory Visit: Payer: Self-pay

## 2024-01-05 VITALS — BP 144/67 | HR 81 | Temp 98.3°F | Resp 18 | Ht 70.0 in | Wt 255.0 lb

## 2024-01-05 DIAGNOSIS — I10 Essential (primary) hypertension: Secondary | ICD-10-CM | POA: Insufficient documentation

## 2024-01-05 DIAGNOSIS — G8929 Other chronic pain: Secondary | ICD-10-CM | POA: Diagnosis not present

## 2024-01-05 DIAGNOSIS — G4733 Obstructive sleep apnea (adult) (pediatric): Secondary | ICD-10-CM | POA: Diagnosis not present

## 2024-01-05 DIAGNOSIS — M549 Dorsalgia, unspecified: Secondary | ICD-10-CM | POA: Insufficient documentation

## 2024-01-05 DIAGNOSIS — Z981 Arthrodesis status: Secondary | ICD-10-CM | POA: Insufficient documentation

## 2024-01-05 DIAGNOSIS — E119 Type 2 diabetes mellitus without complications: Secondary | ICD-10-CM | POA: Insufficient documentation

## 2024-01-05 DIAGNOSIS — I4519 Other right bundle-branch block: Secondary | ICD-10-CM | POA: Insufficient documentation

## 2024-01-05 DIAGNOSIS — M1711 Unilateral primary osteoarthritis, right knee: Secondary | ICD-10-CM | POA: Insufficient documentation

## 2024-01-05 DIAGNOSIS — M48061 Spinal stenosis, lumbar region without neurogenic claudication: Secondary | ICD-10-CM | POA: Insufficient documentation

## 2024-01-05 DIAGNOSIS — Z01818 Encounter for other preprocedural examination: Secondary | ICD-10-CM | POA: Insufficient documentation

## 2024-01-05 HISTORY — DX: Depression, unspecified: F32.A

## 2024-01-05 HISTORY — DX: Unspecified osteoarthritis, unspecified site: M19.90

## 2024-01-05 LAB — BASIC METABOLIC PANEL WITH GFR
Anion gap: 11 (ref 5–15)
BUN: 21 mg/dL (ref 8–23)
CO2: 22 mmol/L (ref 22–32)
Calcium: 9.1 mg/dL (ref 8.9–10.3)
Chloride: 106 mmol/L (ref 98–111)
Creatinine, Ser: 0.9 mg/dL (ref 0.61–1.24)
GFR, Estimated: >60 mL/min (ref >60)
Glucose, Bld: 190 mg/dL — ABNORMAL HIGH (ref 70–99)
Potassium: 3.8 mmol/L (ref 3.5–5.1)
Sodium: 139 mmol/L (ref 135–145)

## 2024-01-05 LAB — CBC
HCT: 46.8 % (ref 39.0–52.0)
Hemoglobin: 14.9 g/dL (ref 13.0–17.0)
MCH: 28.8 pg (ref 26.0–34.0)
MCHC: 31.8 g/dL (ref 30.0–36.0)
MCV: 90.5 fL (ref 80.0–100.0)
Platelets: 250 K/uL (ref 150–400)
RBC: 5.17 MIL/uL (ref 4.22–5.81)
RDW: 13.2 % (ref 11.5–15.5)
WBC: 13.9 K/uL — ABNORMAL HIGH (ref 4.0–10.5)
nRBC: 0 % (ref 0.0–0.2)

## 2024-01-05 LAB — SURGICAL PCR SCREEN
MRSA, PCR: NEGATIVE
Staphylococcus aureus: NEGATIVE

## 2024-01-05 LAB — GLUCOSE, CAPILLARY: Glucose-Capillary: 201 mg/dL — ABNORMAL HIGH (ref 70–99)

## 2024-01-05 NOTE — Telephone Encounter (Signed)
 Patient called and said he is at his preadmission and they said over night stay and he stated that blackman told him 3-4 days stay. So which one is it. CB#[phone]

## 2024-01-06 ENCOUNTER — Encounter (HOSPITAL_COMMUNITY): Payer: Self-pay

## 2024-01-06 NOTE — Telephone Encounter (Signed)
 LMOM of the below message from blackman

## 2024-01-06 NOTE — Progress Notes (Signed)
 Case: [id number] Date/Time: 01/13/24 1100   Procedure: ARTHROPLASTY, KNEE, TOTAL (Right: Knee)   Anesthesia type: Spinal   Diagnosis: Primary osteoarthritis of right knee [M17.11]   Pre-op  diagnosis: osteoarthritis right knee   [facility] ROOM 10 / [facility]   Surgeons: Vernetta Lonni GRADE, MD       DISCUSSION: Curtis Hansen is a 68 yo male with PMH of HTN, PAD s/p angioplasty (2023), OSA (no CPAP), DM (A1c 7.0 in ortho office per Dr. Vernetta), anxiety, arthritis, s/p cervial fusion (05/2022), frequent falls.  Patient follows with Vascular surgery at [facility] for hx of PVD. Patient has previously undergone right AT and TPT balloon angioplasty (10/2021). He takes ASA and statin. Last seen in clinic on 12/15/23. Stable at that visit. Advised 1 yr f/u.  Follows with pain management for chronic back pain. He mobilizes in a wheelchair. Underwent radiofrequency ablation of the thoracic spine at T8-T10 on 01/04/24.   Of note WBC mildly elevated to 13.9. Discussed with patient over the phone. He reports feeling well. Denies any infectious symptoms. Denies any redness/pain/drainage from back injection. Anticipate he can proceed.  LD Plavix : 8/7  VS: BP (!) 144/67   Pulse 81   Temp 36.8 C (Oral)   Resp 18   Ht 5' 10 (1.778 m)   Wt 115.7 kg   SpO2 98%   BMI 36.59 kg/m   PROVIDERS: Turbett, Evalene PARAS, MD Vascular Pain mgmt  LABS: Mild leukocytosis noted. Pt denies infectious symptoms. (all labs ordered are listed, but only abnormal results are displayed)  Labs Reviewed  CBC - Abnormal; Notable for the following components:      Result Value   WBC 13.9 (*)    All other components within normal limits  BASIC METABOLIC PANEL WITH GFR - Abnormal; Notable for the following components:   Glucose, Bld 190 (*)    All other components within normal limits  GLUCOSE, CAPILLARY - Abnormal; Notable for the following components:   Glucose-Capillary 201 (*)    All other components within normal  limits  SURGICAL PCR SCREEN  TYPE AND SCREEN     IMAGES: MRI Thoracic/Lumbar spine 10/04/23:  IMPRESSION: MR THORACIC SPINE IMPRESSION   Generalized degeneration with mild scoliosis. No progression or neural compression.   MR LUMBAR SPINE IMPRESSION   Advanced generalized degeneration with scoliosis.   Resolved marrow edema at the L4-5 disc space.   Diffusely patent spinal canal after multilevel laminectomy. Moderate right foraminal narrowing at L4-5.  EKG 01/05/24:  Normal sinus rhythm, rate 72 Possible Left atrial enlargement Left axis deviation Incomplete right bundle branch block Compared to previous EKG QRS duration is shorter  CV:  US  ABI 12/15/23 ([facility]):  Conclusion  RIGHT  Incompressibility of arteries suggests arterial calcification. Segmental pressures cannot be measured. Ankle-brachial index cannot be calculated.  Essentially normal pulse volume recordings and Doppler waveforms suggest no significant right lower extremity disease. Normal toe-brachial index.   LEFT  Incompressibility of arteries suggests arterial calcification. Segmental pressures cannot be measured. Ankle-brachial index cannot be calculated.  Essentially normal pulse volume recordings and Doppler waveforms suggest no significant left lower extremity disease. Normal toe-brachial index.   Past Medical History:  Diagnosis Date   Abnormality of gait    Anxiety    Arthritis    Cognitive communication deficit    Depression    Diabetes (HCC)    Gangrene (HCC)    Hyperlipemia    Hypertension    Multiple fractures    right ribs  OSA (obstructive sleep apnea)    Repeated falls    Spinal stenosis     Past Surgical History:  Procedure Laterality Date   COLONOSCOPY WITH PROPOFOL  N/A 04/06/2022   Procedure: COLONOSCOPY WITH PROPOFOL ;  Surgeon: Eartha Angelia Sieving, MD;  [facility];  Service: Gastroenterology;  Laterality: N/A;  10:00 asa 2 PATIENT IS IN [facility]   HEMOSTASIS CLIP PLACEMENT  04/06/2022   Procedure: HEMOSTASIS CLIP PLACEMENT;  Surgeon: Eartha Angelia Sieving, MD;  [facility];  Service: Gastroenterology;;   POLYPECTOMY  04/06/2022   Procedure: POLYPECTOMY;  Surgeon: Eartha Angelia Sieving, MD;  [facility];  Service: Gastroenterology;;   POSTERIOR CERVICAL FUSION/FORAMINOTOMY N/A 06/07/2022   Procedure: Posterior cervical fusion with lateral mass fixation - Cervical three - Cervical six, laminectomy  Cervical three-five;  Surgeon: Joshua Alm RAMAN, MD;  Location: [facility] OR;  Service: Neurosurgery;  Laterality: N/A;    MEDICATIONS:  acetaminophen  (TYLENOL ) 500 MG tablet   aspirin EC 81 MG tablet   atorvastatin (LIPITOR) 40 MG tablet   bisacodyl  (DULCOLAX) 10 MG suppository   busPIRone (BUSPAR) 10 MG tablet   clopidogrel (PLAVIX) 75 MG tablet   Continuous Glucose Sensor (FREESTYLE LIBRE 3 SENSOR) MISC   dextromethorphan-guaiFENesin (ROBITUSSIN-DM) 10-100 MG/5ML liquid   diphenhydrAMINE  (BENADRYL ) 25 mg capsule   escitalopram (LEXAPRO) 20 MG tablet   fenofibrate  160 MG tablet   glipiZIDE (GLUCOTROL XL) 2.5 MG 24 hr tablet   hydrOXYzine (ATARAX) 25 MG tablet   lamoTRIgine (LAMICTAL) 25 MG tablet   loperamide (IMODIUM) 2 MG capsule   Melatonin 10 MG TABS   metoprolol  succinate (TOPROL -XL) 25 MG 24 hr tablet   ondansetron  (ZOFRAN ) 4 MG tablet   polyethylene glycol (MIRALAX  / GLYCOLAX ) 17 g packet   senna (SENOKOT) 8.6 MG TABS tablet   tamsulosin  (FLOMAX ) 0.4 MG CAPS capsule   traMADol (ULTRAM) 50 MG tablet   No current facility-administered medications for this encounter.   Burnard CHRISTELLA Odis DEVONNA [facility] Surgical Short Stay/Anesthesiology [facility] Phone [phone] 01/09/2024 9:41 AM

## 2024-01-09 NOTE — Anesthesia Preprocedure Evaluation (Addendum)
 Anesthesia Evaluation  Patient identified by MRN, date of birth, ID band Patient awake    Reviewed: Allergy & Precautions, NPO status , Patient's Chart, lab work & pertinent test results, reviewed documented beta blocker date and time   History of Anesthesia Complications Negative for: history of anesthetic complications  Airway Mallampati: III  TM Distance: >3 FB Neck ROM: Limited    Dental no notable dental hx.    Pulmonary sleep apnea , neg COPD   breath sounds clear to auscultation       Cardiovascular Exercise Tolerance: Poor METS: < 3 Mets hypertension, (-) angina + Peripheral Vascular Disease  (-) CAD and (-) Past MI  Rhythm:Regular Rate:Normal     Neuro/Psych neg Seizures PSYCHIATRIC DISORDERS Anxiety Depression       GI/Hepatic ,neg GERD  ,,(+) neg Cirrhosis        Endo/Other  diabetes    Renal/GU Renal disease     Musculoskeletal  (+) Arthritis , Osteoarthritis,    Abdominal   Peds  Hematology   Anesthesia Other Findings   Reproductive/Obstetrics                              Anesthesia Physical Anesthesia Plan  ASA: 3  Anesthesia Plan: General   Post-op Pain Management: Regional block*   Induction: Intravenous  PONV Risk Score and Plan: 2 and Ondansetron , Dexamethasone  and Propofol  infusion  Airway Management Planned: LMA  Additional Equipment:   Intra-op Plan:   Post-operative Plan: Extubation in OR  Informed Consent: I have reviewed the patients History and Physical, chart, labs and discussed the procedure including the risks, benefits and alternatives for the proposed anesthesia with the patient or authorized representative who has indicated his/her understanding and acceptance.     Dental advisory given  Plan Discussed with: CRNA  Anesthesia Plan Comments: (See PAT note from 8/7)         Anesthesia Quick Evaluation

## 2024-01-12 NOTE — H&P (Signed)
 TOTAL KNEE ADMISSION H&P  Patient is being admitted for right total knee arthroplasty.  Subjective:  Chief Complaint:right knee pain.  HPI: Curtis Hansen, 68 y.o. male, has a history of pain and functional disability in the right knee due to arthritis and has failed non-surgical conservative treatments for greater than 12 weeks to includeNSAID's and/or analgesics, corticosteriod injections, flexibility and strengthening excercises, use of assistive devices, weight reduction as appropriate, and activity modification.  Onset of symptoms was gradual, starting several years ago with gradually worsening course since that time. The patient noted prior procedures on the knee to include  quad tendon repair on the right knee(s).  Patient currently rates pain in the right knee(s) at 10 out of 10 with activity. Patient has night pain, worsening of pain with activity and weight bearing, pain that interferes with activities of daily living, pain with passive range of motion, crepitus, and joint swelling.  Patient has evidence of subchondral sclerosis, periarticular osteophytes, and joint space narrowing by imaging studies. There is no active infection.  Patient Active Problem List   Diagnosis Date Noted   Unilateral primary osteoarthritis, right knee 12/05/2023   S/P cervical spinal fusion 06/07/2022   Constipation 02/15/2022   Encounter for colonoscopy due to history of adenomatous colonic polyps 02/15/2022   Cellulitis and abscess of leg 11/18/2021   Past Medical History:  Diagnosis Date   Abnormality of gait    Anxiety    Arthritis    Cognitive communication deficit    Depression    Diabetes (HCC)    Gangrene (HCC)    Hyperlipemia    Hypertension    Multiple fractures    right ribs   OSA (obstructive sleep apnea)    Repeated falls    Spinal stenosis     Past Surgical History:  Procedure Laterality Date   COLONOSCOPY WITH PROPOFOL  N/A 04/06/2022   Procedure: COLONOSCOPY WITH PROPOFOL ;   Surgeon: Eartha Angelia Sieving, MD;  Location: [facility];  Service: Gastroenterology;  Laterality: N/A;  10:00 asa 2 PATIENT IS IN [facility]   HEMOSTASIS CLIP PLACEMENT  04/06/2022   Procedure: HEMOSTASIS CLIP PLACEMENT;  Surgeon: Eartha Angelia Sieving, MD;  Location: [facility];  Service: Gastroenterology;;   POLYPECTOMY  04/06/2022   Procedure: POLYPECTOMY;  Surgeon: Eartha Angelia Sieving, MD;  Location: [facility];  Service: Gastroenterology;;   POSTERIOR CERVICAL FUSION/FORAMINOTOMY N/A 06/07/2022   Procedure: Posterior cervical fusion with lateral mass fixation - Cervical three - Cervical six, laminectomy  Cervical three-five;  Surgeon: Joshua Alm RAMAN, MD;  Location[facility];  Service: Neurosurgery;  Laterality: N/A;    No current facility-administered medications for this encounter.   Current Outpatient Medications  Medication Sig Dispense Refill Last Dose/Taking   acetaminophen  (TYLENOL ) 500 MG tablet Take 500 mg by mouth in the morning and at bedtime.   Taking   aspirin  EC 81 MG tablet Take 81 mg by mouth daily.   Taking   atorvastatin  (LIPITOR) 40 MG tablet Take 40 mg by mouth daily.   Taking   bisacodyl  (DULCOLAX) 10 MG suppository Place 10 mg rectally daily as needed for moderate constipation, mild constipation or severe constipation.   Taking As Needed   busPIRone  (BUSPAR ) 10 MG tablet Take 10 mg by mouth 2 (two) times daily.   Taking   clopidogrel  (PLAVIX ) 75 MG tablet Take 75 mg by mouth daily.   Taking   Continuous Glucose Sensor (FREESTYLE LIBRE 3 SENSOR) MISC 1 application  by Does  not apply route every 14 (fourteen) days.   Taking   dextromethorphan-guaiFENesin (ROBITUSSIN-DM) 10-100 MG/5ML liquid Take 10 mLs by mouth every 4 (four) hours as needed for cough.   Taking As Needed   diphenhydrAMINE  (BENADRYL ) 25 mg capsule Take 25 mg by mouth every 6 (six) hours as needed for itching.   Taking As Needed   escitalopram  (LEXAPRO ) 20 MG tablet Take 20 mg  by mouth daily.   Taking   fenofibrate  160 MG tablet Take 160 mg by mouth daily.   Taking   glipiZIDE  (GLUCOTROL  XL) 2.5 MG 24 hr tablet Take 2.5 mg by mouth daily.   Taking   hydrOXYzine  (ATARAX ) 25 MG tablet Take 25 mg by mouth every 8 (eight) hours as needed for anxiety.   Taking As Needed   lamoTRIgine  (LAMICTAL ) 25 MG tablet Take 50 mg by mouth 2 (two) times daily.   Taking   loperamide (IMODIUM) 2 MG capsule Take 2 mg by mouth as needed for diarrhea or loose stools.   Taking As Needed   Melatonin 10 MG TABS Take 1 tablet by mouth at bedtime.   Taking   metoprolol  succinate (TOPROL -XL) 25 MG 24 hr tablet Take 12.5 mg by mouth daily.   Taking   ondansetron  (ZOFRAN ) 4 MG tablet Take 4 mg by mouth every 4 (four) hours as needed for nausea or vomiting.   Taking As Needed   polyethylene glycol (MIRALAX  / GLYCOLAX ) 17 g packet Take 17 g by mouth every 12 (twelve) hours as needed for moderate constipation.   Taking As Needed   senna (SENOKOT) 8.6 MG TABS tablet Take 2 tablets by mouth 2 (two) times daily as needed for moderate constipation.   Taking As Needed   tamsulosin  (FLOMAX ) 0.4 MG CAPS capsule Take 0.4 mg by mouth daily.   Taking   traMADol (ULTRAM) 50 MG tablet Take 50 mg by mouth every 6 (six) hours as needed.   Taking As Needed   Allergies  Allergen Reactions   Rosuvastatin Nausea Only   Grass Pollen(K-O-R-T-Swt Vern) Cough   Celecoxib Other (See Comments)    Social History   Tobacco Use   Smoking status: Never    Passive exposure: Never   Smokeless tobacco: Never  Substance Use Topics   Alcohol use: Never    No family history on file.   Review of Systems  Objective:  Physical Exam Vitals reviewed.  Constitutional:      Appearance: Normal appearance. He is obese.  HENT:     Head: Normocephalic and atraumatic.  Eyes:     Extraocular Movements: Extraocular movements intact.     Pupils: Pupils are equal, round, and reactive to light.  Cardiovascular:     Rate and  Rhythm: Normal rate and regular rhythm.  Pulmonary:     Effort: Pulmonary effort is normal.     Breath sounds: Normal breath sounds.  Abdominal:     Palpations: Abdomen is soft.  Musculoskeletal:     Cervical back: Normal range of motion and neck supple.     Left knee: Effusion, bony tenderness and crepitus present. Decreased range of motion. Tenderness present over the medial joint line, lateral joint line and patellar tendon.  Neurological:     Mental Status: He is alert and oriented to person, place, and time.  Psychiatric:        Behavior: Behavior normal.     Vital signs in last 24 hours:    Labs:   Estimated body mass index  is 36.59 kg/m as calculated from the following:   Height as of 01/05/24: 5' 10 (1.778 m).   Weight as of 01/05/24: 115.7 kg.   Imaging Review Plain radiographs demonstrate severe degenerative joint disease of the right knee(s). The overall alignment ismild varus. The bone quality appears to be fair for age and reported activity level.      Assessment/Plan:  End stage arthritis, right knee   The patient history, physical examination, clinical judgment of the provider and imaging studies are consistent with end stage degenerative joint disease of the right knee(s) and total knee arthroplasty is deemed medically necessary. The treatment options including medical management, injection therapy arthroscopy and arthroplasty were discussed at length. The risks and benefits of total knee arthroplasty were presented and reviewed. The risks due to aseptic loosening, infection, stiffness, patella tracking problems, thromboembolic complications and other imponderables were discussed. The patient acknowledged the explanation, agreed to proceed with the plan and consent was signed. Patient is being admitted for inpatient treatment for surgery, pain control, PT, OT, prophylactic antibiotics, VTE prophylaxis, progressive ambulation and ADL's and discharge planning. The  patient is planning to be discharged home with home health services

## 2024-01-13 ENCOUNTER — Ambulatory Visit (HOSPITAL_BASED_OUTPATIENT_CLINIC_OR_DEPARTMENT_OTHER): Admitting: Anesthesiology

## 2024-01-13 ENCOUNTER — Ambulatory Visit (HOSPITAL_COMMUNITY): Payer: Self-pay | Admitting: Physician Assistant

## 2024-01-13 ENCOUNTER — Encounter (HOSPITAL_COMMUNITY)
Admission: RE | Disposition: A | Discharge status: Home / Self Care | Payer: Self-pay | Source: Home / Self Care | Attending: Orthopaedic Surgery

## 2024-01-13 ENCOUNTER — Observation Stay (HOSPITAL_COMMUNITY)

## 2024-01-13 ENCOUNTER — Observation Stay (HOSPITAL_COMMUNITY)
Admission: RE | Admit: 2024-01-13 | Discharge: 2024-01-16 | Disposition: A | Discharge status: Home / Self Care | Attending: Orthopaedic Surgery | Admitting: Orthopaedic Surgery

## 2024-01-13 ENCOUNTER — Encounter (HOSPITAL_COMMUNITY): Payer: Self-pay | Admitting: Orthopaedic Surgery

## 2024-01-13 ENCOUNTER — Other Ambulatory Visit: Payer: Self-pay

## 2024-01-13 DIAGNOSIS — M1711 Unilateral primary osteoarthritis, right knee: Secondary | ICD-10-CM | POA: Diagnosis not present

## 2024-01-13 DIAGNOSIS — Z7982 Long term (current) use of aspirin: Secondary | ICD-10-CM | POA: Diagnosis not present

## 2024-01-13 DIAGNOSIS — F418 Other specified anxiety disorders: Secondary | ICD-10-CM | POA: Diagnosis not present

## 2024-01-13 DIAGNOSIS — E119 Type 2 diabetes mellitus without complications: Secondary | ICD-10-CM

## 2024-01-13 DIAGNOSIS — Z96651 Presence of right artificial knee joint: Secondary | ICD-10-CM | POA: Diagnosis not present

## 2024-01-13 DIAGNOSIS — I10 Essential (primary) hypertension: Secondary | ICD-10-CM | POA: Diagnosis not present

## 2024-01-13 LAB — TYPE AND SCREEN
ABO/RH(D): O POS
Antibody Screen: NEGATIVE

## 2024-01-13 LAB — GLUCOSE, CAPILLARY
Glucose-Capillary: 113 mg/dL — ABNORMAL HIGH (ref 70–99)
Glucose-Capillary: 122 mg/dL — ABNORMAL HIGH (ref 70–99)

## 2024-01-13 SURGERY — ARTHROPLASTY, KNEE, TOTAL
Anesthesia: General | Site: Knee | Laterality: Right

## 2024-01-13 MED ORDER — MIDAZOLAM HCL 2 MG/2ML IJ SOLN
1.0000 mg | Freq: Once | INTRAMUSCULAR | Status: AC
  Administered 2024-01-13: 2 mg via INTRAVENOUS
  Filled 2024-01-13: qty 2

## 2024-01-13 MED ORDER — FENTANYL CITRATE (PF) 100 MCG/2ML IJ SOLN
INTRAMUSCULAR | Status: DC | PRN
  Administered 2024-01-13 (×2): 50 ug via INTRAVENOUS

## 2024-01-13 MED ORDER — OXYCODONE HCL 5 MG PO TABS
5.0000 mg | ORAL_TABLET | ORAL | Status: DC | PRN
  Administered 2024-01-13: 5 mg via ORAL
  Administered 2024-01-14 – 2024-01-15 (×3): 10 mg via ORAL
  Filled 2024-01-13 (×4): qty 2
  Filled 2024-01-13: qty 1

## 2024-01-13 MED ORDER — DOCUSATE SODIUM 100 MG PO CAPS
100.0000 mg | ORAL_CAPSULE | Freq: Two times a day (BID) | ORAL | Status: DC
  Administered 2024-01-13 – 2024-01-16 (×6): 100 mg via ORAL
  Filled 2024-01-13 (×6): qty 1

## 2024-01-13 MED ORDER — DEXAMETHASONE SODIUM PHOSPHATE 10 MG/ML IJ SOLN
INTRAMUSCULAR | Status: DC | PRN
Start: 2024-01-13 — End: 2024-01-13
  Administered 2024-01-13: 10 mg

## 2024-01-13 MED ORDER — BUSPIRONE HCL 10 MG PO TABS
10.0000 mg | ORAL_TABLET | Freq: Two times a day (BID) | ORAL | Status: DC
  Administered 2024-01-13 – 2024-01-16 (×6): 10 mg via ORAL
  Filled 2024-01-13 (×6): qty 1

## 2024-01-13 MED ORDER — METOCLOPRAMIDE HCL 5 MG PO TABS: 5.0000 mg | ORAL_TABLET | Freq: Three times a day (TID) | ORAL | Status: DC | PRN

## 2024-01-13 MED ORDER — SODIUM CHLORIDE 0.9 % IR SOLN
Status: DC | PRN
Start: 2024-01-13 — End: 2024-01-13
  Administered 2024-01-13: 1000 mL

## 2024-01-13 MED ORDER — LAMOTRIGINE 25 MG PO TABS
50.0000 mg | ORAL_TABLET | Freq: Two times a day (BID) | ORAL | Status: DC
  Administered 2024-01-13 – 2024-01-16 (×6): 50 mg via ORAL
  Filled 2024-01-13 (×6): qty 2

## 2024-01-13 MED ORDER — METOPROLOL SUCCINATE ER 25 MG PO TB24
12.5000 mg | ORAL_TABLET | Freq: Every day | ORAL | Status: DC
  Administered 2024-01-14 – 2024-01-16 (×3): 12.5 mg via ORAL
  Filled 2024-01-13 (×3): qty 1

## 2024-01-13 MED ORDER — ORAL CARE MOUTH RINSE: 15.0000 mL | Freq: Once | OROMUCOSAL | Status: AC

## 2024-01-13 MED ORDER — EPHEDRINE SULFATE (PRESSORS) 50 MG/ML IJ SOLN
INTRAMUSCULAR | Status: DC | PRN
  Administered 2024-01-13: 10 mg via INTRAVENOUS

## 2024-01-13 MED ORDER — FENTANYL CITRATE (PF) 100 MCG/2ML IJ SOLN
INTRAMUSCULAR | Status: AC
Start: 2024-01-13 — End: 2024-01-13
  Filled 2024-01-13: qty 2

## 2024-01-13 MED ORDER — PHENYLEPHRINE HCL-NACL 20-0.9 MG/250ML-% IV SOLN
INTRAVENOUS | Status: AC
Start: 2024-01-13 — End: 2024-01-13
  Filled 2024-01-13: qty 250

## 2024-01-13 MED ORDER — ONDANSETRON HCL 4 MG/2ML IJ SOLN: 4.0000 mg | Freq: Four times a day (QID) | INTRAMUSCULAR | Status: DC | PRN

## 2024-01-13 MED ORDER — EPHEDRINE 5 MG/ML INJ
INTRAVENOUS | Status: AC
  Filled 2024-01-13: qty 5

## 2024-01-13 MED ORDER — ESCITALOPRAM OXALATE 20 MG PO TABS
20.0000 mg | ORAL_TABLET | Freq: Every day | ORAL | Status: DC
  Administered 2024-01-13 – 2024-01-16 (×4): 20 mg via ORAL
  Filled 2024-01-13 (×4): qty 1

## 2024-01-13 MED ORDER — SODIUM CHLORIDE (PF) 0.9 % IJ SOLN
INTRAMUSCULAR | Status: AC
  Filled 2024-01-13: qty 10

## 2024-01-13 MED ORDER — PHENYLEPHRINE 80 MCG/ML (10ML) SYRINGE FOR IV PUSH (FOR BLOOD PRESSURE SUPPORT)
PREFILLED_SYRINGE | INTRAVENOUS | Status: AC
  Filled 2024-01-13: qty 10

## 2024-01-13 MED ORDER — OXYCODONE HCL 5 MG PO TABS: 5.0000 mg | ORAL_TABLET | Freq: Once | ORAL | Status: DC | PRN

## 2024-01-13 MED ORDER — CLOPIDOGREL BISULFATE 75 MG PO TABS
75.0000 mg | ORAL_TABLET | Freq: Every day | ORAL | Status: DC
  Administered 2024-01-14 – 2024-01-16 (×3): 75 mg via ORAL
  Filled 2024-01-13 (×3): qty 1

## 2024-01-13 MED ORDER — DEXMEDETOMIDINE HCL IN NACL 80 MCG/20ML IV SOLN
INTRAVENOUS | Status: DC | PRN
  Administered 2024-01-13: 8 ug via INTRAVENOUS

## 2024-01-13 MED ORDER — ASPIRIN 81 MG PO TBEC
81.0000 mg | DELAYED_RELEASE_TABLET | Freq: Every day | ORAL | Status: DC
  Administered 2024-01-13 – 2024-01-16 (×4): 81 mg via ORAL
  Filled 2024-01-13 (×4): qty 1

## 2024-01-13 MED ORDER — BISACODYL 10 MG RE SUPP: 10.0000 mg | Freq: Every day | RECTAL | Status: DC | PRN

## 2024-01-13 MED ORDER — HYDROMORPHONE HCL 1 MG/ML IJ SOLN
0.5000 mg | INTRAMUSCULAR | Status: DC | PRN
  Administered 2024-01-14 – 2024-01-15 (×2): 1 mg via INTRAVENOUS
  Filled 2024-01-13 (×2): qty 1

## 2024-01-13 MED ORDER — PROPOFOL 500 MG/50ML IV EMUL
INTRAVENOUS | Status: DC | PRN
  Administered 2024-01-13: 20 ug/kg/min via INTRAVENOUS
  Administered 2024-01-13: 150 mg via INTRAVENOUS

## 2024-01-13 MED ORDER — DIPHENHYDRAMINE HCL 12.5 MG/5ML PO ELIX: 12.5000 mg | ORAL_SOLUTION | ORAL | Status: DC | PRN

## 2024-01-13 MED ORDER — BUPIVACAINE-EPINEPHRINE (PF) 0.25% -1:200000 IJ SOLN
INTRAMUSCULAR | Status: AC
  Filled 2024-01-13: qty 30

## 2024-01-13 MED ORDER — PHENOL 1.4 % MT LIQD: 1.0000 | OROMUCOSAL | Status: DC | PRN

## 2024-01-13 MED ORDER — POLYETHYLENE GLYCOL 3350 17 G PO PACK: 17.0000 g | PACK | Freq: Every day | ORAL | Status: DC | PRN

## 2024-01-13 MED ORDER — ACETAMINOPHEN 10 MG/ML IV SOLN: 1000.0000 mg | Freq: Once | INTRAVENOUS | Status: DC | PRN

## 2024-01-13 MED ORDER — LIDOCAINE HCL (PF) 2 % IJ SOLN
INTRAMUSCULAR | Status: AC
  Filled 2024-01-13: qty 5

## 2024-01-13 MED ORDER — FENTANYL CITRATE PF 50 MCG/ML IJ SOSY: 25.0000 ug | PREFILLED_SYRINGE | INTRAMUSCULAR | Status: DC | PRN

## 2024-01-13 MED ORDER — GLIPIZIDE ER 2.5 MG PO TB24
2.5000 mg | ORAL_TABLET | Freq: Every day | ORAL | Status: DC
  Administered 2024-01-14 – 2024-01-16 (×3): 2.5 mg via ORAL
  Filled 2024-01-13 (×3): qty 1

## 2024-01-13 MED ORDER — HYDROMORPHONE HCL 2 MG/ML IJ SOLN
INTRAMUSCULAR | Status: AC
Start: 2024-01-13 — End: 2024-01-13
  Filled 2024-01-13: qty 1

## 2024-01-13 MED ORDER — DEXAMETHASONE SODIUM PHOSPHATE 10 MG/ML IJ SOLN
INTRAMUSCULAR | Status: AC
  Filled 2024-01-13: qty 1

## 2024-01-13 MED ORDER — MELATONIN 5 MG PO TABS
10.0000 mg | ORAL_TABLET | Freq: Every day | ORAL | Status: DC
  Administered 2024-01-13 – 2024-01-15 (×3): 10 mg via ORAL
  Filled 2024-01-13 (×3): qty 2

## 2024-01-13 MED ORDER — INSULIN ASPART 100 UNIT/ML IJ SOLN: 0.0000 [IU] | INTRAMUSCULAR | Status: DC | PRN

## 2024-01-13 MED ORDER — TAMSULOSIN HCL 0.4 MG PO CAPS
0.4000 mg | ORAL_CAPSULE | Freq: Every day | ORAL | Status: DC
  Administered 2024-01-13 – 2024-01-16 (×4): 0.4 mg via ORAL
  Filled 2024-01-13 (×4): qty 1

## 2024-01-13 MED ORDER — LIDOCAINE HCL (CARDIAC) PF 100 MG/5ML IV SOSY
PREFILLED_SYRINGE | INTRAVENOUS | Status: DC | PRN
  Administered 2024-01-13: 40 mg via INTRAVENOUS

## 2024-01-13 MED ORDER — ONDANSETRON HCL 4 MG/2ML IJ SOLN: 4.0000 mg | Freq: Once | INTRAMUSCULAR | Status: DC | PRN

## 2024-01-13 MED ORDER — PROPOFOL 500 MG/50ML IV EMUL: INTRAVENOUS | Status: DC | PRN

## 2024-01-13 MED ORDER — POVIDONE-IODINE 10 % EX SWAB
2.0000 | Freq: Once | CUTANEOUS | Status: AC
  Administered 2024-01-13: 2 via TOPICAL

## 2024-01-13 MED ORDER — METHOCARBAMOL 1000 MG/10ML IJ SOLN
500.0000 mg | Freq: Four times a day (QID) | INTRAMUSCULAR | Status: DC | PRN
Start: 2024-01-13 — End: 2024-01-16

## 2024-01-13 MED ORDER — HYDROMORPHONE HCL 1 MG/ML IJ SOLN
INTRAMUSCULAR | Status: DC | PRN
  Administered 2024-01-13: .4 mg via INTRAVENOUS
  Administered 2024-01-13 (×2): .2 mg via INTRAVENOUS

## 2024-01-13 MED ORDER — CHLORHEXIDINE GLUCONATE 0.12 % MT SOLN
15.0000 mL | Freq: Once | OROMUCOSAL | Status: AC
  Administered 2024-01-13: 15 mL via OROMUCOSAL

## 2024-01-13 MED ORDER — MELATONIN 10 MG PO TABS: 1.0000 | ORAL_TABLET | Freq: Every day | ORAL | Status: DC

## 2024-01-13 MED ORDER — DEXMEDETOMIDINE HCL IN NACL 80 MCG/20ML IV SOLN
INTRAVENOUS | Status: AC
  Filled 2024-01-13: qty 20

## 2024-01-13 MED ORDER — STERILE WATER FOR IRRIGATION IR SOLN
Status: DC | PRN
  Administered 2024-01-13: 2000 mL

## 2024-01-13 MED ORDER — ATORVASTATIN CALCIUM 40 MG PO TABS
40.0000 mg | ORAL_TABLET | Freq: Every day | ORAL | Status: DC
  Administered 2024-01-14 – 2024-01-16 (×3): 40 mg via ORAL
  Filled 2024-01-13 (×3): qty 1

## 2024-01-13 MED ORDER — OXYCODONE HCL 5 MG PO TABS
10.0000 mg | ORAL_TABLET | ORAL | Status: DC | PRN
  Administered 2024-01-13 – 2024-01-16 (×4): 10 mg via ORAL
  Filled 2024-01-13 (×3): qty 2

## 2024-01-13 MED ORDER — METHOCARBAMOL 500 MG PO TABS
500.0000 mg | ORAL_TABLET | Freq: Four times a day (QID) | ORAL | Status: DC | PRN
Start: 2024-01-13 — End: 2024-01-16
  Administered 2024-01-13 – 2024-01-16 (×5): 500 mg via ORAL
  Filled 2024-01-13 (×4): qty 1

## 2024-01-13 MED ORDER — ALUM & MAG HYDROXIDE-SIMETH 200-200-20 MG/5ML PO SUSP
30.0000 mL | ORAL | Status: DC | PRN
Start: 2024-01-13 — End: 2024-01-16

## 2024-01-13 MED ORDER — METOCLOPRAMIDE HCL 5 MG/ML IJ SOLN: 5.0000 mg | Freq: Three times a day (TID) | INTRAMUSCULAR | Status: DC | PRN

## 2024-01-13 MED ORDER — PHENYLEPHRINE HCL (PRESSORS) 10 MG/ML IV SOLN
INTRAVENOUS | Status: DC | PRN
  Administered 2024-01-13: 100 ug via INTRAVENOUS

## 2024-01-13 MED ORDER — FENTANYL CITRATE PF 50 MCG/ML IJ SOSY
50.0000 ug | PREFILLED_SYRINGE | Freq: Once | INTRAMUSCULAR | Status: AC
  Administered 2024-01-13: 50 ug via INTRAVENOUS
  Filled 2024-01-13: qty 2

## 2024-01-13 MED ORDER — PANTOPRAZOLE SODIUM 40 MG PO TBEC
40.0000 mg | DELAYED_RELEASE_TABLET | Freq: Every day | ORAL | Status: DC
  Administered 2024-01-13 – 2024-01-16 (×4): 40 mg via ORAL
  Filled 2024-01-13 (×4): qty 1

## 2024-01-13 MED ORDER — LACTATED RINGERS IV SOLN: INTRAVENOUS | Status: DC

## 2024-01-13 MED ORDER — CEFAZOLIN SODIUM-DEXTROSE 2-4 GM/100ML-% IV SOLN
2.0000 g | Freq: Four times a day (QID) | INTRAVENOUS | Status: AC
  Administered 2024-01-13 (×2): 2 g via INTRAVENOUS
  Filled 2024-01-13 (×2): qty 100

## 2024-01-13 MED ORDER — ONDANSETRON HCL 4 MG PO TABS: 4.0000 mg | ORAL_TABLET | Freq: Four times a day (QID) | ORAL | Status: DC | PRN

## 2024-01-13 MED ORDER — HYDROXYZINE HCL 25 MG PO TABS: 25.0000 mg | ORAL_TABLET | Freq: Three times a day (TID) | ORAL | Status: DC | PRN

## 2024-01-13 MED ORDER — ACETAMINOPHEN 325 MG PO TABS
325.0000 mg | ORAL_TABLET | Freq: Four times a day (QID) | ORAL | Status: DC | PRN
  Administered 2024-01-14: 650 mg via ORAL
  Filled 2024-01-13: qty 2

## 2024-01-13 MED ORDER — TRANEXAMIC ACID-NACL 1000-0.7 MG/100ML-% IV SOLN
1000.0000 mg | INTRAVENOUS | Status: AC
  Administered 2024-01-13: 1000 mg via INTRAVENOUS
  Filled 2024-01-13: qty 100

## 2024-01-13 MED ORDER — SODIUM CHLORIDE 0.9 % IV SOLN: INTRAVENOUS | Status: AC

## 2024-01-13 MED ORDER — MENTHOL 3 MG MT LOZG: 1.0000 | LOZENGE | OROMUCOSAL | Status: DC | PRN

## 2024-01-13 MED ORDER — ONDANSETRON HCL 4 MG/2ML IJ SOLN
INTRAMUSCULAR | Status: AC
  Filled 2024-01-13: qty 2

## 2024-01-13 MED ORDER — CEFAZOLIN SODIUM-DEXTROSE 2-4 GM/100ML-% IV SOLN
2.0000 g | INTRAVENOUS | Status: AC
  Administered 2024-01-13: 2 g via INTRAVENOUS
  Filled 2024-01-13: qty 100

## 2024-01-13 MED ORDER — BUPIVACAINE-EPINEPHRINE 0.25% -1:200000 IJ SOLN
INTRAMUSCULAR | Status: DC | PRN
  Administered 2024-01-13: 30 mL

## 2024-01-13 MED ORDER — FENOFIBRATE 160 MG PO TABS
160.0000 mg | ORAL_TABLET | Freq: Every day | ORAL | Status: DC
  Administered 2024-01-14 – 2024-01-16 (×3): 160 mg via ORAL
  Filled 2024-01-13 (×3): qty 1

## 2024-01-13 MED ORDER — 0.9 % SODIUM CHLORIDE (POUR BTL) OPTIME
TOPICAL | Status: DC | PRN
  Administered 2024-01-13: 1000 mL

## 2024-01-13 MED ORDER — ROPIVACAINE HCL 5 MG/ML IJ SOLN
INTRAMUSCULAR | Status: DC | PRN
Start: 2024-01-13 — End: 2024-01-13
  Administered 2024-01-13: 20 mL via PERINEURAL

## 2024-01-13 MED ORDER — OXYCODONE HCL 5 MG/5ML PO SOLN: 5.0000 mg | Freq: Once | ORAL | Status: DC | PRN

## 2024-01-13 SURGICAL SUPPLY — 50 items
BAG COUNTER SPONGE SURGICOUNT (BAG) IMPLANT
BAG ZIPLOCK 12X15 (MISCELLANEOUS) ×1 IMPLANT
BENZOIN TINCTURE PRP APPL 2/3 (GAUZE/BANDAGES/DRESSINGS) IMPLANT
BLADE SAG 18X100X1.27 (BLADE) ×1 IMPLANT
BNDG ELASTIC 6INX 5YD STR LF (GAUZE/BANDAGES/DRESSINGS) ×2 IMPLANT
BOWL SMART MIX CTS (DISPOSABLE) IMPLANT
CEMENT BONE R 1X40 (Cement) IMPLANT
COMPONENT FEM CMT PRSONA SZ9RT (Joint) IMPLANT
COMPONENT TIB CMT PS KN F 0DRT (Joint) IMPLANT
COOLER ICEMAN CLASSIC (MISCELLANEOUS) ×1 IMPLANT
COVER SURGICAL LIGHT HANDLE (MISCELLANEOUS) ×1 IMPLANT
CUFF TRNQT CYL 34X4.125X (TOURNIQUET CUFF) ×1 IMPLANT
DRAPE U-SHAPE 47X51 STRL (DRAPES) ×1 IMPLANT
DURAPREP 26ML APPLICATOR (WOUND CARE) ×1 IMPLANT
ELECT BLADE TIP CTD 4 INCH (ELECTRODE) ×1 IMPLANT
ELECT PENCIL ROCKER SW 15FT (MISCELLANEOUS) ×1 IMPLANT
ELECT REM PT RETURN 15FT ADLT (MISCELLANEOUS) ×1 IMPLANT
GAUZE PAD ABD 8X10 STRL (GAUZE/BANDAGES/DRESSINGS) ×2 IMPLANT
GAUZE SPONGE 4X4 12PLY STRL (GAUZE/BANDAGES/DRESSINGS) ×1 IMPLANT
GAUZE XEROFORM 1X8 LF (GAUZE/BANDAGES/DRESSINGS) IMPLANT
GLOVE BIO SURGEON STRL SZ7.5 (GLOVE) ×1 IMPLANT
GLOVE BIOGEL PI IND STRL 8 (GLOVE) ×2 IMPLANT
GLOVE ECLIPSE 8.0 STRL XLNG CF (GLOVE) ×1 IMPLANT
GOWN STRL REUS W/ TWL XL LVL3 (GOWN DISPOSABLE) ×2 IMPLANT
HOLDER FOLEY CATH W/STRAP (MISCELLANEOUS) IMPLANT
IMMOBILIZER KNEE 20 THIGH 36 (SOFTGOODS) ×1 IMPLANT
INSERT TIB ASF SZ8-11 14 RT (Insert) IMPLANT
KIT TURNOVER KIT A (KITS) ×1 IMPLANT
MANIFOLD NEPTUNE II (INSTRUMENTS) ×1 IMPLANT
NS IRRIG 1000ML POUR BTL (IV SOLUTION) ×1 IMPLANT
PACK TOTAL KNEE CUSTOM (KITS) ×1 IMPLANT
PAD COLD SHLDR WRAP-ON (PAD) ×1 IMPLANT
PADDING CAST COTTON 6X4 STRL (CAST SUPPLIES) ×2 IMPLANT
PIN DRILL HDLS TROCAR 75 4PK (PIN) IMPLANT
PROTECTOR NERVE ULNAR (MISCELLANEOUS) ×1 IMPLANT
SCREW FEMALE HEX FIX 25X2.5 (ORTHOPEDIC DISPOSABLE SUPPLIES) IMPLANT
SET HNDPC FAN SPRY TIP SCT (DISPOSABLE) ×1 IMPLANT
SET PAD KNEE POSITIONER (MISCELLANEOUS) ×1 IMPLANT
SPIKE FLUID TRANSFER (MISCELLANEOUS) IMPLANT
STAPLER SKIN PROX 35W (STAPLE) IMPLANT
STEM POLY PAT PLY 35M KNEE (Knees) IMPLANT
STRIP CLOSURE SKIN 1/2X4 (GAUZE/BANDAGES/DRESSINGS) IMPLANT
SUT MNCRL AB 4-0 PS2 18 (SUTURE) IMPLANT
SUT VIC AB 0 CT1 36 (SUTURE) ×1 IMPLANT
SUT VIC AB 1 CT1 36 (SUTURE) ×2 IMPLANT
SUT VIC AB 2-0 CT1 TAPERPNT 27 (SUTURE) ×2 IMPLANT
TOWEL GREEN STERILE FF (TOWEL DISPOSABLE) ×1 IMPLANT
TRAY FOLEY MTR SLVR 16FR STAT (SET/KITS/TRAYS/PACK) IMPLANT
WATER STERILE IRR 1000ML POUR (IV SOLUTION) ×2 IMPLANT
YANKAUER SUCT BULB TIP NO VENT (SUCTIONS) ×1 IMPLANT

## 2024-01-13 NOTE — Interval H&P Note (Signed)
 History and Physical Interval Note: The patient understands that he is here today for a right total knee replacement to treat his significant right knee pain and arthritis.  There has been no acute or interval change in his medical status.  The risks and benefits of surgery have been discussed in detail and informed consent has been obtained.  The right operative knee has been marked.  01/13/2024 9:54 AM  Curtis Hansen  has presented today for surgery, with the diagnosis of osteoarthritis right knee.  The various methods of treatment have been discussed with the patient and family. After consideration of risks, benefits and other options for treatment, the patient has consented to  Procedure(s): ARTHROPLASTY, KNEE, TOTAL (Right) as a surgical intervention.  The patient's history has been reviewed, patient examined, no change in status, stable for surgery.  I have reviewed the patient's chart and labs.  Questions were answered to the patient's satisfaction.     [name]

## 2024-01-13 NOTE — Op Note (Signed)
 Operative Note  Date of operation: 01/13/2024 Preoperative diagnosis: Right knee primary osteoarthritis Postoperative diagnosis: Same  Procedure: Right cemented total knee arthroplasty  Implants: Biomet/Zimmer persona cemented knee system Implant Name Type Inv. Item Serial No. Manufacturer Lot No. LRB No. Used Action  CEMENT BONE R 1X40 - [id number] Cement CEMENT BONE R 1X40  ZIMMER RECON(ORTH,TRAU,BIO,SG) [id number] Right 2 Implanted  INSERT TIB ASF SZ8-11 14 RT - [id number] Insert INSERT TIB ASF SZ8-11 14 RT  ZIMMER RECON(ORTH,TRAU,BIO,SG) [id number] Right 1 Implanted  STEM POLY PAT PLY 88M KNEE - [id number] Knees STEM POLY PAT PLY 88M KNEE  ZIMMER RECON(ORTH,TRAU,BIO,SG) [id number] Right 1 Implanted  COMPONENT FEM CMT PRSONA SZ9RT - [id number] Joint COMPONENT FEM CMT PRSONA SZ9RT  ZIMMER [employer]ON(ORTH,TRAU,BIO,SG) [id number] Right 1 Implanted  COMPONENT TIB CMT PS KN F 0DRT - [id number] Joint COMPONENT TIB CMT PS LENOARD JULIANNA KNIGHT  ZIMMER [employer]ON(ORTH,TRAU,BIO,SG) [id number] Right 1 Implanted   Surgeon: Lonni GRADE. Vernetta, MD Assistant: Tory Gaskins, PA-C  Anesthesia: #1 right lower extremity adductor canal block, #2 General, #3 local EBL: Less than 100 cc Tourniquet time: Less than 1 hour Antibiotics: IV Ancef  Complications: None  Indications: The patient is a 68 year old gentleman with limited mobility secondary to multiple medical issues as well as right knee arthritis.  He does stay at some type of nursing facility and [place] [facility] .  He does get around mainly in a wheelchair but some of his pain has been limited significantly and mobility limited by his knee arthritis.  At this point he does wish proceed with a knee replacement the right knee having failed all forms of conservative treatment including PT and OT.  He does have x-rays showing osteopenic bone with severe arthritis.  We did discuss the risks of acute blood loss anemia, nerve vessel injury, fracture, infection, implant  failure, DVT and wound healing issues.  He understands that our goals are hopefully decreased pain, improved mobility and improve quality of life.  Procedure description: After informed consent was obtained and the appropriate right knee was marked, anesthesia obtained a right lower extremity adductor canal block in the holding room.  The patient was then brought to the operating room and placed upon the operating table where general anesthesia was obtained.  A nonsterile tourniquet placed around his upper right thigh and his right thigh, knee, leg, ankle and foot were prepped and draped with DuraPrep and sterile drapes including sterile stockinette.  A timeout was called and he was identified as the correct patient and the correct right knee.  An Esmarch was then used to wrap out the leg and the tourniquet was inflated to 350 mm pressure.  With the knee extended a direct midline incision was then made over the patella and carried proximally distally.  Dissection was carried down to the knee joint and a medial parapatellar arthrotomy was carried out finding a moderate joint effusion.  We did find osteophytes in all the compartments and significant osteoarthritis throughout the knee.  Remnants of the ACL as well as medial and lateral meniscus were removed.  Osteophytes were removed from all 3 compartments.  With the knee in a flexed position we then used an extramedullary based cutting guide for making her proximal tibia cut correcting for varus and valgus and a 7 degree slope.  We made this cut to take 2 mm off the low side and we did backed this down to more millimeters based on our cut.  We then used an intramedullary cutting guide for distal femur cut  setting this for a right knee at 5 degrees externally rotated and a 10 mm distal femoral cut.  We brought this knee back down to full extension and it was extended fully with a 10 mm block.  We then impacted the femur and put a femoral sizing guide based off the  epicondylar axis.  Based off of this we chose a size 9 femur.  We put a 4-in-1 cutting block for a size 9 femur and made our anterior and posterior cuts followed our chamfer cuts.  We then backed the tibia and chose a size F right tibial tray for coverage over the tibial plateau setting the rotation of the tibial tubercle and the femur.  We did our drill hole and keel punch off of this again finding very soft bone.  We then trialed our size F right tibia followed by our size 9 right standard CR femur.  We trialed up to a 14 mm thickness polyethylene insert and we are pleased with stability without insert.  We then made our patella cut and drilled 3 holes for we are pleased with range of motion and stability.  We then removed all transportation from the knee and irrigated the knee with normal saline solution.  We then placed Marcaine  with epinephrine  around the arthrotomy.  Next with the knee in a flexed position we mixed our cement and dried the knee real well.  We then cemented our Biomet/Zimmer persona tibial tray for right knee size F followed by cementing our size 9 right CR standard femur.  We placed our 14 mm right medial congruent polyethylene insert and cemented our size 35 patella button.  We then let the tourniquet down and hemostasis was obtained electrocautery.  We did let the cement hardened and The knee fully compressed and extended while the cemented hardened.  We then closed the arthrotomy with interrupted #1 Vicryl suture followed by 0 Vicryl to close the deep tissue and 2-0 Vicryl to close subcutaneous tissue.  The skin was closed with staples.  Well-padded sterile dressing was applied.  The patient was awakened, extubated and taken the recovery room.  Tory Gaskins, PA-C did assist her in the entire case and beginning the end and his assistance was crucial and medically necessary for soft tissue management and retraction, helping guide implant placement and a layered closure of the wound.

## 2024-01-13 NOTE — Plan of Care (Signed)

## 2024-01-13 NOTE — Anesthesia Procedure Notes (Signed)
 Procedure Name: LMA Insertion Date/Time: 01/13/2024 11:13 AM  Performed by: Kathern Rollene LABOR, CRNAPre-anesthesia Checklist: Patient identified, Emergency Drugs available, Suction available and Patient being monitored Patient Re-evaluated:Patient Re-evaluated prior to induction Oxygen Delivery Method: Circle system utilized Preoxygenation: Pre-oxygenation with 100% oxygen Induction Type: IV induction Ventilation: Mask ventilation without difficulty LMA: LMA inserted LMA Size: 4.0 Tube type: Oral Number of attempts: 1 Placement Confirmation: ETT inserted through vocal cords under direct vision, positive ETCO2 and breath sounds checked- equal and bilateral Tube secured with: Tape Dental Injury: Teeth and Oropharynx as per pre-operative assessment

## 2024-01-13 NOTE — Anesthesia Procedure Notes (Signed)
 Anesthesia Regional Block: Adductor canal block   Pre-Anesthetic Checklist: , timeout performed,  Correct Patient, Correct Site, Correct Laterality,  Correct Procedure, Correct Position, site marked,  Risks and benefits discussed,  Surgical consent,  Pre-op  evaluation,  At surgeon's request and post-op pain management  Laterality: Right  Prep: Maximum Sterile Barrier Precautions used, chloraprep       Needles:  Injection technique: Single-shot  Needle Type: Echogenic Needle      Needle Gauge: 20     Additional Needles:   Procedures:,,,, ultrasound used (permanent image in chart),,    Narrative:  Start time: 01/13/2024 10:30 AM End time: 01/13/2024 10:35 AM Injection made incrementally with aspirations every 5 mL.  Performed by: Personally  Anesthesiologist: Keneth Lynwood POUR, MD

## 2024-01-13 NOTE — Transfer of Care (Signed)
 Immediate Anesthesia Transfer of Care Note  Patient: Curtis Hansen  Procedure(s) Performed: ARTHROPLASTY, KNEE, TOTAL (Right: Knee)  Patient Location: PACU  Anesthesia Type:General  Level of Consciousness: oriented, drowsy, and patient cooperative  Airway & Oxygen Therapy: Patient Spontanous Breathing and Patient connected to face mask oxygen  Post-op Assessment: Report given to RN and Post -op Vital signs reviewed and stable  Post vital signs: Reviewed and stable  Last Vitals:  Vitals Value Taken Time  BP 169/87 01/13/24 12:56  Temp    Pulse 84 01/13/24 13:00  Resp 12 01/13/24 13:00  SpO2 100 % 01/13/24 13:00  Vitals shown include unfiled device data.  Last Pain:  Vitals:   01/13/24 1035  TempSrc:   PainSc: 0-No pain         Complications: No notable events documented.

## 2024-01-14 DIAGNOSIS — M1711 Unilateral primary osteoarthritis, right knee: Secondary | ICD-10-CM | POA: Diagnosis not present

## 2024-01-14 LAB — GLUCOSE, CAPILLARY: Glucose-Capillary: 202 mg/dL — ABNORMAL HIGH (ref 70–99)

## 2024-01-14 LAB — CBC
HCT: 43.2 % (ref 39.0–52.0)
Hemoglobin: 13.5 g/dL (ref 13.0–17.0)
MCH: 29 pg (ref 26.0–34.0)
MCHC: 31.3 g/dL (ref 30.0–36.0)
MCV: 92.7 fL (ref 80.0–100.0)
Platelets: 215 K/uL (ref 150–400)
RBC: 4.66 MIL/uL (ref 4.22–5.81)
RDW: 13.3 % (ref 11.5–15.5)
WBC: 13.7 K/uL — ABNORMAL HIGH (ref 4.0–10.5)
nRBC: 0 % (ref 0.0–0.2)

## 2024-01-14 LAB — BASIC METABOLIC PANEL WITH GFR
Anion gap: 9 (ref 5–15)
BUN: 16 mg/dL (ref 8–23)
CO2: 23 mmol/L (ref 22–32)
Calcium: 8.1 mg/dL — ABNORMAL LOW (ref 8.9–10.3)
Chloride: 103 mmol/L (ref 98–111)
Creatinine, Ser: 0.7 mg/dL (ref 0.61–1.24)
GFR, Estimated: >60 mL/min (ref >60)
Glucose, Bld: 230 mg/dL — ABNORMAL HIGH (ref 70–99)
Potassium: 4.1 mmol/L (ref 3.5–5.1)
Sodium: 135 mmol/L (ref 135–145)

## 2024-01-14 MED ORDER — METHOCARBAMOL 500 MG PO TABS: 500.0000 mg | ORAL_TABLET | Freq: Four times a day (QID) | ORAL | 0 refills | Status: AC | PRN

## 2024-01-14 MED ORDER — OXYCODONE HCL 5 MG PO TABS: 5.0000 mg | ORAL_TABLET | ORAL | 0 refills | Status: AC | PRN

## 2024-01-14 NOTE — Evaluation (Signed)
 Physical Therapy Evaluation Patient Details Name: Curtis Hansen MRN: [id number] DOB: 03/26/1956 Today's Date: 01/14/2024  History of Present Illness  Pt s/p R TKR and with hx of DM, spinal stenosis, cervical fusion, R quad tendon repair and cognitive communication deficit.  Clinical Impression  Pt admitted as above and presenting with functional mobility limitations 2* generalized weakness/deconditioning, decreased R LE strength/ROM, post op pain, balance deficits and obesity.  Patient will benefit from continued inpatient follow up therapy, <3 hours/day.       If plan is discharge home, recommend the following:     Can travel by private vehicle   No    Equipment Recommendations None recommended by PT  Recommendations for Other Services       Functional Status Assessment Patient has had a recent decline in their functional status and demonstrates the ability to make significant improvements in function in a reasonable and predictable amount of time.     Precautions / Restrictions Precautions Precautions: Fall;Knee Restrictions Weight Bearing Restrictions Per Provider Order: Yes RLE Weight Bearing Per Provider Order: Weight bearing as tolerated      Mobility  Bed Mobility Overal bed mobility: Needs Assistance Bed Mobility: Supine to Sit, Sit to Supine     Supine to sit: Mod assist, +2 for physical assistance, +2 for safety/equipment Sit to supine: Mod assist, +2 for physical assistance, +2 for safety/equipment   General bed mobility comments: cues for sequence and use of L LE to self assist.  Use of bed rail and physical assist to manage R LE and to control trunk    Transfers Overall transfer level: Needs assistance   Transfers: Sit to/from Stand Sit to Stand: Mod assist, +2 physical assistance, +2 safety/equipment, From elevated surface           General transfer comment: Pt stood x two with use of bed to bring pt to standing and mod assist x 2 to bring wt  fwd to balance in RW.  Pt tolerating min WB on R LE    Ambulation/Gait               General Gait Details: Pt stood x two but unable to initiate step  Careers information officer     Tilt Bed    Modified Rankin (Stroke Patients Only)       Balance Overall balance assessment: Needs assistance Sitting-balance support: No upper extremity supported, Feet supported Sitting balance-Leahy Scale: Good     Standing balance support: Bilateral upper extremity supported Standing balance-Leahy Scale: Poor                               Pertinent Vitals/Pain Pain Assessment Pain Assessment: 0-10 Pain Score: 4  Pain Location: R knee Pain Descriptors / Indicators: Aching, Sore Pain Intervention(s): Limited activity within patient's tolerance, Monitored during session, Premedicated before session, Ice applied    Home Living Family/patient expects to be discharged to:: Skilled nursing facility                        Prior Function Prior Level of Function : Needs assist             Mobility Comments: Pt reports lateral scoots bed<>WC and has not ambulated x several years       Extremity/Trunk Assessment   Upper Extremity Assessment Upper Extremity Assessment: Generalized  weakness    Lower Extremity Assessment Lower Extremity Assessment: Generalized weakness;RLE deficits/detail RLE Deficits / Details: AAROM at knee -10 - 50; 2/5 quads       Communication   Communication Communication: No apparent difficulties    Cognition Arousal: Alert Behavior During Therapy: WFL for tasks assessed/performed                             Following commands: Intact       Cueing Cueing Techniques: Verbal cues     General Comments      Exercises Total Joint Exercises Ankle Circles/Pumps: AROM, Both, 15 reps, Supine Quad Sets: AROM, Both, 10 reps, Supine Heel Slides: AAROM, Right, 15 reps, Supine Straight Leg Raises:  AAROM, Right, 10 reps, Supine   Assessment/Plan    PT Assessment Patient needs continued PT services  PT Problem List Decreased strength;Decreased range of motion;Decreased activity tolerance;Decreased balance;Decreased mobility;Decreased knowledge of use of DME;Obesity;Pain       PT Treatment Interventions DME instruction;Gait training;Functional mobility training;Therapeutic activities;Therapeutic exercise;Patient/family education;Balance training    PT Goals (Current goals can be found in the Care Plan section)  Acute Rehab PT Goals Patient Stated Goal: Regain IND PT Goal Formulation: With patient Time For Goal Achievement: 01/21/24 Potential to Achieve Goals: Fair    Frequency 7X/week     Co-evaluation               AM-PAC PT 6 Clicks Mobility  Outcome Measure Help needed turning from your back to your side while in a flat bed without using bedrails?: A Lot Help needed moving from lying on your back to sitting on the side of a flat bed without using bedrails?: A Lot Help needed moving to and from a bed to a chair (including a wheelchair)?: A Lot Help needed standing up from a chair using your arms (e.g., wheelchair or bedside chair)?: A Lot Help needed to walk in hospital room?: Total Help needed climbing 3-5 steps with a railing? : Total 6 Click Score: 10    End of Session Equipment Utilized During Treatment: Gait belt;Right knee immobilizer Activity Tolerance: Patient limited by fatigue;Patient limited by pain Patient left: in bed;with call bell/phone within reach;with bed alarm set Nurse Communication: Mobility status PT Visit Diagnosis: Difficulty in walking, not elsewhere classified (R26.2)    Time: 0822-0907 PT Time Calculation (min) (ACUTE ONLY): 45 min   Charges:   PT Evaluation $PT Eval Low Complexity: 1 Low PT Treatments $Therapeutic Exercise: 8-22 mins $Therapeutic Activity: 8-22 mins PT General Charges $$ ACUTE PT VISIT: 1 Visit          [facility] PT Acute Rehabilitation Services Office [phone]   Zain Lankford 01/14/2024, 1:58 PM

## 2024-01-14 NOTE — Progress Notes (Signed)
 Physical Therapy Treatment Patient Details Name: Curtis Hansen MRN: [id number] DOB: [birth year] Today's Date: 01/14/2024   History of Present Illness Pt s/p R TKR and with hx of DM, spinal stenosis, cervical fusion, R quad tendon repair and cognitive communication deficit.    PT Comments  Pt requiring encouragement but agreeable to perform bed level therex - requiring increased time with multiple rest breaks for completion.  Pt positioned for comfort and IceMan applied.    If plan is discharge home, recommend the following:     Can travel by private vehicle     No  Equipment Recommendations  None recommended by PT    Recommendations for Other Services       Precautions / Restrictions Precautions Precautions: Fall;Knee Precaution Booklet Issued: Yes (comment) Recall of Precautions/Restrictions: Intact Required Braces or Orthoses: Knee Immobilizer - Right Knee Immobilizer - Right: Discontinue once straight leg raise with < 10 degree lag Restrictions Weight Bearing Restrictions Per Provider Order: Yes RLE Weight Bearing Per Provider Order: Weight bearing as tolerated     Mobility  Bed Mobility Overal bed mobility: Needs Assistance Bed Mobility: Supine to Sit, Sit to Supine     Supine to sit: Mod assist, +2 for physical assistance, +2 for safety/equipment Sit to supine: Mod assist, +2 for physical assistance, +2 for safety/equipment   General bed mobility comments: OOB deferred this pm    Transfers Overall transfer level: Needs assistance   Transfers: Sit to/from Stand Sit to Stand: Mod assist, +2 physical assistance, +2 safety/equipment, From elevated surface           General transfer comment: Pt stood x two with use of bed to bring pt to standing and mod assist x 2 to bring wt fwd to balance in RW.  Pt tolerating min WB on R LE    Ambulation/Gait               General Gait Details: Pt stood x two but unable to initiate step   Acupuncturist     Tilt Bed    Modified Rankin (Stroke Patients Only)       Balance Overall balance assessment: Needs assistance Sitting-balance support: No upper extremity supported, Feet supported Sitting balance-Leahy Scale: Good     Standing balance support: Bilateral upper extremity supported Standing balance-Leahy Scale: Poor                              Communication Communication Communication: No apparent difficulties  Cognition Arousal: Alert Behavior During Therapy: WFL for tasks assessed/performed                             Following commands: Intact      Cueing Cueing Techniques: Verbal cues  Exercises Total Joint Exercises Ankle Circles/Pumps: AROM, Both, 15 reps, Supine Quad Sets: AROM, Both, 10 reps, Supine Heel Slides: AAROM, Right, 15 reps, Supine Straight Leg Raises: AAROM, Right, 10 reps, Supine    General Comments        Pertinent Vitals/Pain Pain Assessment Pain Assessment: 0-10 Pain Score: 6  Pain Location: R knee Pain Descriptors / Indicators: Aching, Sore Pain Intervention(s): Limited activity within patient's tolerance, Monitored during session, Premedicated before session, Ice applied    Home Living Family/patient expects to be discharged to:: Skilled nursing facility  Prior Function            PT Goals (current goals can now be found in the care plan section) Acute Rehab PT Goals Patient Stated Goal: Regain IND PT Goal Formulation: With patient Time For Goal Achievement: 01/21/24 Potential to Achieve Goals: Fair Progress towards PT goals: Progressing toward goals    Frequency    7X/week      PT Plan      Co-evaluation              AM-PAC PT 6 Clicks Mobility   Outcome Measure  Help needed turning from your back to your side while in a flat bed without using bedrails?: A Lot Help needed moving from lying on your back to sitting on the  side of a flat bed without using bedrails?: A Lot Help needed moving to and from a bed to a chair (including a wheelchair)?: A Lot Help needed standing up from a chair using your arms (e.g., wheelchair or bedside chair)?: A Lot Help needed to walk in hospital room?: Total Help needed climbing 3-5 steps with a railing? : Total 6 Click Score: 10    End of Session Equipment Utilized During Treatment: Gait belt;Right knee immobilizer Activity Tolerance: Patient limited by fatigue;Patient limited by pain Patient left: in bed;with call bell/phone within reach;with bed alarm set Nurse Communication: Mobility status;Need for lift equipment PT Visit Diagnosis: Difficulty in walking, not elsewhere classified (R26.2)     Time: 1420-1450 PT Time Calculation (min) (ACUTE ONLY): 30 min  Charges:    $Therapeutic Exercise: 8-22 mins $Therapeutic Activity: 8-22 mins PT General Charges $$ ACUTE PT VISIT: 1 Visit                     [facility] PT Acute Rehabilitation Services Office [phone]    Asyria Kolander 01/14/2024, 4:05 PM

## 2024-01-14 NOTE — Care Management Obs Status (Signed)
 MEDICARE OBSERVATION STATUS NOTIFICATION   Patient Details  Name: Curtis Hansen MRN: [id number] Date of Birth: 06/12/1955   Medicare Observation Status Notification Given:  Yes    Sheri ONEIDA Sharps, LCSW 01/14/2024, 3:47 PM

## 2024-01-14 NOTE — Discharge Instructions (Signed)
 Per [facility] clinic policy, our goal is ensure optimal postoperative pain control with a multimodal pain management strategy. For all [facility] patients, our goal is to wean post-operative narcotic medications by 6 weeks post-operatively. If this is not possible due to utilization of pain medication prior to surgery, your [facility] doctor will support your acute post-operative pain control for the first 6 weeks postoperatively, with a plan to transition you back to your primary pain team following that. Maralee will work to ensure a Therapist, occupational.  INSTRUCTIONS AFTER JOINT REPLACEMENT   Remove items at home which could result in a fall. This includes throw rugs or furniture in walking pathways ICE to the affected joint every three hours while awake for 30 minutes at a time, for at least the first 3-5 days, and then as needed for pain and swelling.  Continue to use ice for pain and swelling. You may notice swelling that will progress down to the foot and ankle.  This is normal after surgery.  Elevate your leg when you are not up walking on it.   Continue to use the breathing machine you got in the hospital (incentive spirometer) which will help keep your temperature down.  It is common for your temperature to cycle up and down following surgery, especially at night when you are not up moving around and exerting yourself.  The breathing machine keeps your lungs expanded and your temperature down.   DIET:  As you were doing prior to hospitalization, we recommend a well-balanced diet.  DRESSING / WOUND CARE / SHOWERING  Keep the surgical dressing until follow up.  The dressing is water  proof, so you can shower without any extra covering.  IF THE DRESSING FALLS OFF or the wound gets wet inside, change the dressing with sterile gauze.  Please use good hand washing techniques before changing the dressing.  Do not use any lotions or creams on the incision until instructed by your surgeon.     ACTIVITY  Increase activity slowly as tolerated, but follow the weight bearing instructions below.   No driving for 6 weeks or until further direction given by your physician.  You cannot drive while taking narcotics.  No lifting or carrying greater than 10 lbs. until further directed by your surgeon. Avoid periods of inactivity such as sitting longer than an hour when not asleep. This helps prevent blood clots.  You may return to work once you are authorized by your doctor.     WEIGHT BEARING   Weight bearing as tolerated with assist device (walker, cane, etc) as directed, use it as long as suggested by your surgeon or therapist, typically at least 4-6 weeks.   EXERCISES  Results after joint replacement surgery are often greatly improved when you follow the exercise, range of motion and muscle strengthening exercises prescribed by your doctor. Safety measures are also important to protect the joint from further injury. Any time any of these exercises cause you to have increased pain or swelling, decrease what you are doing until you are comfortable again and then slowly increase them. If you have problems or questions, call your caregiver or physical therapist for advice.   Rehabilitation is important following a joint replacement. After just a few days of immobilization, the muscles of the leg can become weakened and shrink (atrophy).  These exercises are designed to build up the tone and strength of the thigh and leg muscles and to improve motion. Often times heat used for twenty to thirty minutes before  working out will loosen up your tissues and help with improving the range of motion but do not use heat for the first two weeks following surgery (sometimes heat can increase post-operative swelling).   These exercises can be done on a training (exercise) mat, on the floor, on a table or on a bed. Use whatever works the best and is most comfortable for you.    Use music or television  while you are exercising so that the exercises are a pleasant break in your day. This will make your life better with the exercises acting as a break in your routine that you can look forward to.   Perform all exercises about fifteen times, three times per day or as directed.  You should exercise both the operative leg and the other leg as well.  Exercises include:   Quad Sets - Tighten up the muscle on the front of the thigh (Quad) and hold for 5-10 seconds.   Straight Leg Raises - With your knee straight (if you were given a brace, keep it on), lift the leg to 60 degrees, hold for 3 seconds, and slowly lower the leg.  Perform this exercise against resistance later as your leg gets stronger.  Leg Slides: Lying on your back, slowly slide your foot toward your buttocks, bending your knee up off the floor (only go as far as is comfortable). Then slowly slide your foot back down until your leg is flat on the floor again.  Angel Wings: Lying on your back spread your legs to the side as far apart as you can without causing discomfort.  Hamstring Strength:  Lying on your back, push your heel against the floor with your leg straight by tightening up the muscles of your buttocks.  Repeat, but this time bend your knee to a comfortable angle, and push your heel against the floor.  You may put a pillow under the heel to make it more comfortable if necessary.   A rehabilitation program following joint replacement surgery can speed recovery and prevent re-injury in the future due to weakened muscles. Contact your doctor or a physical therapist for more information on knee rehabilitation.    CONSTIPATION  Constipation is defined medically as fewer than three stools per week and severe constipation as less than one stool per week.  Even if you have a regular bowel pattern at home, your normal regimen is likely to be disrupted due to multiple reasons following surgery.  Combination of anesthesia, postoperative  narcotics, change in appetite and fluid intake all can affect your bowels.   YOU MUST use at least one of the following options; they are listed in order of increasing strength to get the job done.  They are all available over the counter, and you may need to use some, POSSIBLY even all of these options:    Drink plenty of fluids (prune juice may be helpful) and high fiber foods Colace 100 mg by mouth twice a day  Senokot for constipation as directed and as needed Dulcolax (bisacodyl ), take with full glass of water   Miralax  (polyethylene glycol) once or twice a day as needed.  If you have tried all these things and are unable to have a bowel movement in the first 3-4 days after surgery call either your surgeon or your primary doctor.    If you experience loose stools or diarrhea, hold the medications until you stool forms back up.  If your symptoms do not get better within 1 week  or if they get worse, check with your doctor.  If you experience the worst abdominal pain ever or develop nausea or vomiting, please contact the office immediately for further recommendations for treatment.   ITCHING:  If you experience itching with your medications, try taking only a single pain pill, or even half a pain pill at a time.  You can also use Benadryl  over the counter for itching or also to help with sleep.   TED HOSE STOCKINGS:  Use stockings on both legs until for at least 2 weeks or as directed by physician office. They may be removed at night for sleeping.  MEDICATIONS:  See your medication summary on the "After Visit Summary" that nursing will review with you.  You may have some home medications which will be placed on hold until you complete the course of blood thinner medication.  It is important for you to complete the blood thinner medication as prescribed.  PRECAUTIONS:  If you experience chest pain or shortness of breath - call 911 immediately for transfer to the hospital emergency department.    If you develop a fever greater that 101 F, purulent drainage from wound, increased redness or drainage from wound, foul odor from the wound/dressing, or calf pain - CONTACT YOUR SURGEON.                                                   FOLLOW-UP APPOINTMENTS:  If you do not already have a post-op appointment, please call the office for an appointment to be seen by your surgeon.  Guidelines for how soon to be seen are listed in your "After Visit Summary", but are typically between 1-4 weeks after surgery.  OTHER INSTRUCTIONS:   Knee Replacement:  Do not place pillow under knee, focus on keeping the knee straight while resting. CPM instructions: 0-90 degrees, 2 hours in the morning, 2 hours in the afternoon, and 2 hours in the evening. Place foam block, curve side up under heel at all times except when in CPM or when walking.  DO NOT modify, tear, cut, or change the foam block in any way.  POST-OPERATIVE OPIOID TAPER INSTRUCTIONS: It is important to wean off of your opioid medication as soon as possible. If you do not need pain medication after your surgery it is ok to stop day one. Opioids include: Codeine, Hydrocodone(Norco, Vicodin), Oxycodone (Percocet, oxycontin ) and hydromorphone  amongst others.  Long term and even short term use of opiods can cause: Increased pain response Dependence Constipation Depression Respiratory depression And more.  Withdrawal symptoms can include Flu like symptoms Nausea, vomiting And more Techniques to manage these symptoms Hydrate well Eat regular healthy meals Stay active Use relaxation techniques(deep breathing, meditating, yoga) Do Not substitute Alcohol to help with tapering If you have been on opioids for less than two weeks and do not have pain than it is ok to stop all together.  Plan to wean off of opioids This plan should start within one week post op of your joint replacement. Maintain the same interval or time between taking each dose  and first decrease the dose.  Cut the total daily intake of opioids by one tablet each day Next start to increase the time between doses. The last dose that should be eliminated is the evening dose.   MAKE SURE YOU:  Understand these instructions.  Get help right away if you are not doing well or get worse.    Thank you for letting us  be a part of your medical care team.  It is a privilege we respect greatly.  We hope these instructions will help you stay on track for a fast and full recovery!     Dental Antibiotics:  In most cases prophylactic antibiotics for Dental procdeures after total joint surgery are not necessary.  Exceptions are as follows:  1. History of prior total joint infection  2. Severely immunocompromised (Organ Transplant, cancer chemotherapy, Rheumatoid biologic meds such as Humera)  3. Poorly controlled diabetes (A1C &gt; 8.0, blood glucose over 200)  If you have one of these conditions, contact your surgeon for an antibiotic prescription, prior to your dental procedure.

## 2024-01-14 NOTE — Progress Notes (Signed)
 Subjective: 1 Day Post-Op Procedure(s) (LRB): ARTHROPLASTY, KNEE, TOTAL (Right) Patient reports pain as moderate.    Objective: Vital signs in last 24 hours: Temp:  [97.8 F (36.6 C)-98.4 F (36.9 C)] 97.9 F (36.6 C) (08/16 0557) Pulse Rate:  [70-84] 79 (08/16 0557) Resp:  [8-18] 17 (08/16 0557) BP: (103-163)/(59-118) 142/93 (08/16 0557) SpO2:  [93 %-100 %] 100 % (08/16 0557) Weight:  [115.7 kg] 115.7 kg (08/15 1005)  Intake/Output from previous day: 08/15 0701 - 08/16 0700 In: 2640 [P.O.:820; I.V.:1520; IV Piggyback:300] Out: 860 [Urine:800; Blood:60] Intake/Output this shift: No intake/output data recorded.  Recent Labs    01/14/24 0324  HGB 13.5   Recent Labs    01/14/24 0324  WBC 13.7*  RBC 4.66  HCT 43.2  PLT 215   Recent Labs    01/14/24 0324  NA 135  K 4.1  CL 103  CO2 23  BUN 16  CREATININE 0.70  GLUCOSE 230*  CALCIUM  8.1*   No results for input(s): LABPT, INR in the last 72 hours.  Sensation intact distally Intact pulses distally Dorsiflexion/Plantar flexion intact Incision: dressing C/D/I Compartment soft   Assessment/Plan: 1 Day Post-Op Procedure(s) (LRB): ARTHROPLASTY, KNEE, TOTAL (Right)   The patient barely walks.  Will need continued OT/PT and will need return to his facility by Monday.    [name] 01/14/2024, 8:31 AM

## 2024-01-14 NOTE — Plan of Care (Signed)

## 2024-01-15 ENCOUNTER — Other Ambulatory Visit: Payer: Self-pay

## 2024-01-15 DIAGNOSIS — M1711 Unilateral primary osteoarthritis, right knee: Secondary | ICD-10-CM | POA: Diagnosis not present

## 2024-01-15 LAB — GLUCOSE, CAPILLARY: Glucose-Capillary: 102 mg/dL — ABNORMAL HIGH (ref 70–99)

## 2024-01-15 NOTE — Progress Notes (Signed)
 Orthopedic Surgery Progress Note   Assessment: Patient is a 68 y.o. male with right knee OA status post TKA   Plan: -Operative plans: complete -Diet: diabetic  -DVT ppx: aspirin  81mg  -Weight bearing status: as tolerated -PT evaluate and treat -Pain control -Dispo: remain floor status  ___________________________________________________________________________  Subjective: No acute events overnight. [place] he was having a lot of pain last night but the medication helped. Has had difficulty mobilizing due to the pain.    Physical Exam:  General: no acute distress, appears stated age, laying in bed Neurologic: sleeping but awakes to voice, answering questions appropriately, following commands Respiratory: unlabored breathing on room air, symmetric chest rise  MSK:   -Right lower extremity  Dressings over anterior knee with small amount of blood EHL/TA/GSC intact Plantarflexes and dorsiflexes toes Sensation intact to light touch in sural, saphenous, tibial, deep peroneal, and superficial peroneal nerve distributions Foot warm and well perfused   Yesterday's total administered Morphine  Milligram Equivalents: 50   Patient name: Curtis Hansen Patient MRN: [id number] Date: 01/15/24

## 2024-01-15 NOTE — Anesthesia Postprocedure Evaluation (Signed)
 Anesthesia Post Note  Patient: Curtis Hansen  Procedure(s) Performed: ARTHROPLASTY, KNEE, TOTAL (Right: Knee)     Patient location during evaluation: PACU Anesthesia Type: General Level of consciousness: awake and alert Pain management: pain level controlled Vital Signs Assessment: post-procedure vital signs reviewed and stable Respiratory status: spontaneous breathing, nonlabored ventilation, respiratory function stable and patient connected to nasal cannula oxygen Cardiovascular status: blood pressure returned to baseline and stable Postop Assessment: no apparent nausea or vomiting Anesthetic complications: no   No notable events documented.          Lynwood MARLA Cornea

## 2024-01-15 NOTE — Plan of Care (Signed)
   Problem: Nutrition: Goal: Adequate nutrition will be maintained Outcome: Progressing

## 2024-01-15 NOTE — Progress Notes (Signed)
 Physical Therapy Treatment Patient Details Name: Curtis Hansen MRN: [id number] DOB: 1956/01/11 Today's Date: 01/15/2024   History of Present Illness Pt s/p R TKR and with hx of DM, spinal stenosis, cervical fusion, R quad tendon repair and cognitive communication deficit.    PT Comments  Pt requiring encouragement but cooperative with therex and tolerated move to standing briefly x 2 to allow for changing of saturated bed linens.     If plan is discharge home, recommend the following:     Can travel by private vehicle     No  Equipment Recommendations  None recommended by PT    Recommendations for Other Services       Precautions / Restrictions Precautions Precautions: Fall;Knee Required Braces or Orthoses: Knee Immobilizer - Right Knee Immobilizer - Right: Discontinue once straight leg raise with < 10 degree lag Restrictions Weight Bearing Restrictions Per Provider Order: Yes RLE Weight Bearing Per Provider Order: Weight bearing as tolerated     Mobility  Bed Mobility Overal bed mobility: Needs Assistance Bed Mobility: Supine to Sit, Sit to Supine     Supine to sit: Mod assist, +2 for physical assistance, +2 for safety/equipment Sit to supine: Mod assist, Max assist, +2 for physical assistance, +2 for safety/equipment   General bed mobility comments: Increased time, use of bed rail, assist to manage LEs and control trunk    Transfers Overall transfer level: Needs assistance   Transfers: Sit to/from Stand Sit to Stand: Mod assist, +2 physical assistance, +2 safety/equipment, From elevated surface           General transfer comment: Pt stood x two with use of bed to bring pt to standing and mod assist x 2 to bring wt fwd to balance in RW.  Block for bil feet to avoid feet splaying fwd.  Pt tolerating min WB on R LE    Ambulation/Gait               General Gait Details: Pt stood x two but unable to initiate step   Acupuncturist     Tilt Bed    Modified Rankin (Stroke Patients Only)       Balance Overall balance assessment: Needs assistance Sitting-balance support: No upper extremity supported, Feet supported Sitting balance-Leahy Scale: Good     Standing balance support: Bilateral upper extremity supported Standing balance-Leahy Scale: Poor                              Communication Communication Communication: No apparent difficulties  Cognition Arousal: Alert Behavior During Therapy: WFL for tasks assessed/performed                             Following commands: Intact      Cueing Cueing Techniques: Verbal cues  Exercises Total Joint Exercises Ankle Circles/Pumps: AROM, Both, 15 reps, Supine Quad Sets: AROM, Both, 10 reps, Supine Heel Slides: AAROM, Right, 15 reps, Supine Straight Leg Raises: AAROM, Right, 10 reps, Supine    General Comments        Pertinent Vitals/Pain Pain Assessment Pain Assessment: 0-10 Pain Score: 6  Pain Location: R knee Pain Descriptors / Indicators: Aching, Sore Pain Intervention(s): Limited activity within patient's tolerance, Monitored during session, Premedicated before session, Ice applied    Home Living  Prior Function            PT Goals (current goals can now be found in the care plan section) Acute Rehab PT Goals Patient Stated Goal: Regain IND PT Goal Formulation: With patient Time For Goal Achievement: 01/21/24 Potential to Achieve Goals: Fair Progress towards PT goals: Progressing toward goals    Frequency    7X/week      PT Plan      Co-evaluation              AM-PAC PT 6 Clicks Mobility   Outcome Measure  Help needed turning from your back to your side while in a flat bed without using bedrails?: A Lot Help needed moving from lying on your back to sitting on the side of a flat bed without using bedrails?: A Lot Help needed  moving to and from a bed to a chair (including a wheelchair)?: A Lot Help needed standing up from a chair using your arms (e.g., wheelchair or bedside chair)?: A Lot Help needed to walk in hospital room?: Total Help needed climbing 3-5 steps with a railing? : Total 6 Click Score: 10    End of Session Equipment Utilized During Treatment: Gait belt;Right knee immobilizer Activity Tolerance: Patient limited by fatigue;Patient limited by pain Patient left: in bed;with call bell/phone within reach;with bed alarm set Nurse Communication: Mobility status;Need for lift equipment PT Visit Diagnosis: Difficulty in walking, not elsewhere classified (R26.2)     Time: 8897-8867 PT Time Calculation (min) (ACUTE ONLY): 30 min  Charges:    $Therapeutic Exercise: 8-22 mins $Therapeutic Activity: 8-22 mins PT General Charges $$ ACUTE PT VISIT: 1 Visit                     [facility] PT Acute Rehabilitation Services Office [phone]    Tiara Maultsby 01/15/2024, 1:10 PM

## 2024-01-15 NOTE — Progress Notes (Signed)
 Physical Therapy Treatment Patient Details Name: Curtis Hansen MRN: [id number] DOB: November 13, 1955 Today's Date: 01/15/2024   History of Present Illness Pt s/p R TKR and with hx of DM, spinal stenosis, cervical fusion, R quad tendon repair and cognitive communication deficit.    PT Comments  Pt in better spirits this pm and agreeable to bed therex.  Pt performed same and repositioned in bed with ICEman in place.   If plan is discharge home, recommend the following:     Can travel by private vehicle     No  Equipment Recommendations  None recommended by PT    Recommendations for Other Services       Precautions / Restrictions Precautions Precautions: Fall;Knee Required Braces or Orthoses: Knee Immobilizer - Right Knee Immobilizer - Right: Discontinue once straight leg raise with < 10 degree lag Restrictions Weight Bearing Restrictions Per Provider Order: Yes RLE Weight Bearing Per Provider Order: Weight bearing as tolerated     Mobility  Bed Mobility Overal bed mobility: Needs Assistance Bed Mobility: Supine to Sit, Sit to Supine     Supine to sit: Mod assist, +2 for physical assistance, +2 for safety/equipment Sit to supine: Mod assist, Max assist, +2 for physical assistance, +2 for safety/equipment   General bed mobility comments: Increased time, use of bed rail, assist to manage LEs and control trunk    Transfers Overall transfer level: Needs assistance   Transfers: Sit to/from Stand Sit to Stand: Mod assist, +2 physical assistance, +2 safety/equipment, From elevated surface           General transfer comment: Pt stood x two with use of bed to bring pt to standing and mod assist x 2 to bring wt fwd to balance in RW.  Block for bil feet to avoid feet splaying fwd.  Pt tolerating min WB on R LE    Ambulation/Gait               General Gait Details: Pt stood x two but unable to initiate step   Optometrist     Tilt  Bed    Modified Rankin (Stroke Patients Only)       Balance Overall balance assessment: Needs assistance Sitting-balance support: No upper extremity supported, Feet supported Sitting balance-Leahy Scale: Good     Standing balance support: Bilateral upper extremity supported Standing balance-Leahy Scale: Poor                              Communication Communication Communication: No apparent difficulties  Cognition Arousal: Alert Behavior During Therapy: WFL for tasks assessed/performed                             Following commands: Intact      Cueing Cueing Techniques: Verbal cues  Exercises Total Joint Exercises Ankle Circles/Pumps: AROM, Both, 15 reps, Supine Quad Sets: AROM, Both, 10 reps, Supine Heel Slides: AAROM, Right, Supine, 20 reps Straight Leg Raises: AAROM, Right, Supine, 20 reps Goniometric ROM: AAROM at R knee -7 - 50    General Comments        Pertinent Vitals/Pain Pain Assessment Pain Assessment: 0-10 Pain Score: 4  Pain Location: R knee Pain Descriptors / Indicators: Aching, Sore Pain Intervention(s): Limited activity within patient's tolerance, Monitored during session, Premedicated before session, Ice applied  Home Living                          Prior Function            PT Goals (current goals can now be found in the care plan section) Acute Rehab PT Goals Patient Stated Goal: Regain IND PT Goal Formulation: With patient Time For Goal Achievement: 01/21/24 Potential to Achieve Goals: Fair Progress towards PT goals: Progressing toward goals    Frequency    7X/week      PT Plan      Co-evaluation              AM-PAC PT 6 Clicks Mobility   Outcome Measure  Help needed turning from your back to your side while in a flat bed without using bedrails?: A Lot Help needed moving from lying on your back to sitting on the side of a flat bed without using bedrails?: A Lot Help needed  moving to and from a bed to a chair (including a wheelchair)?: A Lot Help needed standing up from a chair using your arms (e.g., wheelchair or bedside chair)?: A Lot Help needed to walk in hospital room?: Total Help needed climbing 3-5 steps with a railing? : Total 6 Click Score: 10    End of Session Equipment Utilized During Treatment: Gait belt;Right knee immobilizer Activity Tolerance: Patient limited by fatigue;Patient limited by pain Patient left: in bed;with call bell/phone within reach;with bed alarm set Nurse Communication: Mobility status;Need for lift equipment PT Visit Diagnosis: Difficulty in walking, not elsewhere classified (R26.2)     Time: 8561-8484 PT Time Calculation (min) (ACUTE ONLY): 37 min  Charges:    $Therapeutic Exercise: 23-37 mins $Therapeutic Activity: 8-22 mins PT General Charges $$ ACUTE PT VISIT: 1 Visit                     [facility] PT Acute Rehabilitation Services Office [phone]    [name] 01/15/2024, 3:56 PM

## 2024-01-15 NOTE — TOC Initial Note (Signed)
 Transition of Care [facility]) - Initial/Assessment Note    Patient Details  Name: Curtis Hansen MRN: [id number] Date of Birth: 10-28-1955  [facility]) CM/SW Contact:    Sheri ONEIDA Sharps, LCSW Phone Number: 01/15/2024, 1:50 PM  Clinical Narrative:                 Pt from [facility] in [place]. Plan is for pt to return to SNF at dc, room A1 call report [phone]. Will need updated FL2 and dc summary faxed to [phone]. Existing PASRR [id number].   Expected Discharge Plan: Skilled Nursing Facility Barriers to Discharge: Continued Medical Work up   Patient Goals and CMS Choice Patient states their goals for this hospitalization and ongoing recovery are:: return to SNF   Choice offered to / list presented to : NA      Expected Discharge Plan and Services In-house Referral: NA Discharge Planning Services: NA Post Acute Care Choice: Skilled Nursing Facility                   DME Arranged: N/A DME Agency: NA       HH Arranged: NA HH Agency: NA        Prior Living Arrangements/Services   Lives with:: Facility Resident Patient language and need for interpreter reviewed:: Yes Do you feel safe going back to the place where you live?: Yes      Need for Family Participation in Patient Care: Yes (Comment) Care giver support system in place?: Yes (comment)   Criminal Activity/Legal Involvement Pertinent to Current Situation/Hospitalization: No - Comment as needed  Activities of Daily Living   ADL Screening (condition at time of admission) Independently performs ADLs?: Yes (appropriate for developmental age) Is the patient deaf or have difficulty hearing?: No Does the patient have difficulty seeing, even when wearing glasses/contacts?: No Does the patient have difficulty concentrating, remembering, or making decisions?: No  Permission Sought/Granted                  Emotional Assessment Appearance:: Appears stated  age Attitude/Demeanor/Rapport: Engaged Affect (typically observed): Accepting Orientation: : Oriented to Self, Oriented to Place, Oriented to  Time, Oriented to Situation Alcohol / Substance Use: Not Applicable Psych Involvement: No (comment)  Admission diagnosis:  Primary osteoarthritis of right knee [M17.11] Status post total right knee replacement [Z96.651] Patient Active Problem List   Diagnosis Date Noted   Status post total right knee replacement 01/13/2024   Unilateral primary osteoarthritis, right knee 12/05/2023   S/P cervical spinal fusion 06/07/2022   Constipation 02/15/2022   Encounter for colonoscopy due to history of adenomatous colonic polyps 02/15/2022   Cellulitis and abscess of leg 11/18/2021   PCP:  [place] Evalene PARAS, MD Pharmacy:   [facility] - [place], [place] - [address] [place] [address] Phone: [phone] Fax: [phone]     Social Drivers of Health (SDOH) Social History: SDOH Screenings   Food Insecurity: No Food Insecurity (01/13/2024)  Housing: Low Risk  (01/13/2024)  Transportation Needs: No Transportation Needs (01/13/2024)  Utilities: Not At Risk (01/13/2024)  Financial Resource Strain: Not on File (03/26/2014)   Received from [facility]  Physical Activity: Not on File (01/20/2018)   Received from [facility]  Social Connections: Unknown (01/15/2024)  Stress: Not on File (03/26/2014)   Received from [facility]  Tobacco Use: Low Risk  (01/13/2024)   SDOH Interventions:     Readmission Risk Interventions     No  data to display

## 2024-01-16 ENCOUNTER — Encounter (HOSPITAL_COMMUNITY): Payer: Self-pay | Admitting: Orthopaedic Surgery

## 2024-01-16 DIAGNOSIS — M1711 Unilateral primary osteoarthritis, right knee: Secondary | ICD-10-CM | POA: Diagnosis not present

## 2024-01-16 LAB — GLUCOSE, CAPILLARY
Glucose-Capillary: 98 mg/dL (ref 70–99)
Glucose-Capillary: 105 mg/dL — ABNORMAL HIGH (ref 70–99)

## 2024-01-16 NOTE — NC FL2 (Signed)
 [place]  MEDICAID FL2 LEVEL OF CARE FORM     IDENTIFICATION  Patient Name: Curtis Hansen Birthdate: March 18, 1956 Sex: male Admission Date (Current Location): 01/13/2024  [facility] and [place] Number:  Geophysical data processor and Address:  [facility],  [address], [place] [address]      Provider Number: [id number]  Attending Physician Name and Address:  Vernetta Lonni GRADE,*  Relative Name and Phone Number:  Jeremie, [facility])  [phone] [facility])    Current Level of Care: Hospital Recommended Level of Care: Skilled Nursing Facility Prior Approval Number:    Date Approved/Denied:   PASRR Number: [id number]  Discharge Plan: SNF    Current Diagnoses: Patient Active Problem List   Diagnosis Date Noted   Status post total right knee replacement 01/13/2024   Unilateral primary osteoarthritis, right knee 12/05/2023   S/P cervical spinal fusion 06/07/2022   Constipation 02/15/2022   Encounter for colonoscopy due to history of adenomatous colonic polyps 02/15/2022   Cellulitis and abscess of leg 11/18/2021    Orientation RESPIRATION BLADDER Height & Weight     Self, Time, Situation, Place  Normal Incontinent, External catheter Weight: 115.6 kg Height:  5' 10 (177.8 cm)  BEHAVIORAL SYMPTOMS/MOOD NEUROLOGICAL BOWEL NUTRITION STATUS      Continent Diet (Carb Modified)  AMBULATORY STATUS COMMUNICATION OF NEEDS Skin   Extensive Assist Verbally Surgical wounds (Right Total Knee Arthroplasty)                       Personal Care Assistance Level of Assistance  Bathing, Feeding, Dressing Bathing Assistance: Maximum assistance Feeding assistance: Maximum assistance Dressing Assistance: Maximum assistance     Functional Limitations Info  Sight, Hearing, Speech Sight Info: Adequate Hearing Info: Adequate Speech Info: Adequate    SPECIAL CARE FACTORS FREQUENCY  PT (By licensed PT), OT (By licensed OT)     PT Frequency: 5x/wk OT  Frequency: 5x/wk            Contractures Contractures Info: Not present    Additional Factors Info  Code Status, Allergies, Psychotropic Code Status Info: Full Code Allergies Info: Rosuvastatin, Grass Pollen(k-[id number]-t-swt Vern), Celecoxi Psychotropic Info: Buspar  10mg  po BID, Lexapro  10mg  po daily         Current Medications (01/16/2024):  This is the current hospital active medication list Current Facility-Administered Medications  Medication Dose Route Frequency Provider Last Rate Last Admin   acetaminophen  (TYLENOL ) tablet 325-650 mg  325-650 mg Oral Q6H PRN Vernetta Lonni GRADE, MD   650 mg at 01/14/24 2121   alum & mag hydroxide-simeth (MAALOX/MYLANTA) 200-200-20 MG/5ML suspension 30 mL  30 mL Oral Q4H PRN Vernetta Lonni GRADE, MD       aspirin  EC tablet 81 mg  81 mg Oral Daily Vernetta Lonni GRADE, MD   81 mg at 01/16/24 1012   atorvastatin  (LIPITOR) tablet 40 mg  40 mg Oral Daily Blackman, Christopher Y, MD   40 mg at 01/16/24 1012   bisacodyl  (DULCOLAX) suppository 10 mg  10 mg Rectal Daily PRN Vernetta Lonni GRADE, MD       busPIRone  (BUSPAR ) tablet 10 mg  10 mg Oral BID Vernetta Lonni GRADE, MD   10 mg at 01/16/24 1012   clopidogrel  (PLAVIX ) tablet 75 mg  75 mg Oral Daily Vernetta Lonni GRADE, MD   75 mg at 01/16/24 1012   diphenhydrAMINE  (BENADRYL ) 12.5 MG/5ML elixir 12.5-25 mg  12.5-25 mg Oral Q4H PRN Vernetta Lonni GRADE, MD  docusate sodium  (COLACE) capsule 100 mg  100 mg Oral BID Vernetta Lonni GRADE, MD   100 mg at 01/16/24 1012   escitalopram  (LEXAPRO ) tablet 20 mg  20 mg Oral Daily Vernetta Lonni GRADE, MD   20 mg at 01/16/24 1012   fenofibrate  tablet 160 mg  160 mg Oral Daily Vernetta Lonni GRADE, MD   160 mg at 01/16/24 1012   glipiZIDE  (GLUCOTROL  XL) 24 hr tablet 2.5 mg  2.5 mg Oral Q breakfast Vernetta Lonni GRADE, MD   2.5 mg at 01/16/24 1012   HYDROmorphone  (DILAUDID ) injection 0.5-1 mg  0.5-1 mg Intravenous Q4H PRN Vernetta Lonni GRADE, MD   1 mg at 01/15/24 1630   hydrOXYzine  (ATARAX ) tablet 25 mg  25 mg Oral Q8H PRN Vernetta Lonni GRADE, MD       lamoTRIgine  (LAMICTAL ) tablet 50 mg  50 mg Oral BID Vernetta Lonni GRADE, MD   50 mg at 01/16/24 1011   melatonin tablet 10 mg  10 mg Oral QHS Vernetta Lonni GRADE, MD   10 mg at 01/15/24 2135   menthol -cetylpyridinium (CEPACOL) lozenge 3 mg  1 lozenge Oral PRN Vernetta Lonni GRADE, MD       Or   phenol (CHLORASEPTIC) mouth spray 1 spray  1 spray Mouth/Throat PRN Vernetta Lonni GRADE, MD       methocarbamol  (ROBAXIN ) tablet 500 mg  500 mg Oral Q6H PRN Vernetta Lonni GRADE, MD   500 mg at 01/16/24 1011   Or   methocarbamol  (ROBAXIN ) injection 500 mg  500 mg Intravenous Q6H PRN Vernetta Lonni GRADE, MD       metoCLOPramide  (REGLAN ) tablet 5-10 mg  5-10 mg Oral Q8H PRN Vernetta Lonni GRADE, MD       Or   metoCLOPramide  (REGLAN ) injection 5-10 mg  5-10 mg Intravenous Q8H PRN Vernetta Lonni GRADE, MD       metoprolol  succinate (TOPROL -XL) 24 hr tablet 12.5 mg  12.5 mg Oral Daily Vernetta Lonni GRADE, MD   12.5 mg at 01/16/24 1012   ondansetron  (ZOFRAN ) tablet 4 mg  4 mg Oral Q6H PRN Vernetta Lonni GRADE, MD       Or   ondansetron  (ZOFRAN ) injection 4 mg  4 mg Intravenous Q6H PRN Vernetta Lonni GRADE, MD       oxyCODONE  (Oxy IR/ROXICODONE ) immediate release tablet 10-15 mg  10-15 mg Oral Q4H PRN Vernetta Lonni GRADE, MD   10 mg at 01/15/24 1326   oxyCODONE  (Oxy IR/ROXICODONE ) immediate release tablet 5-10 mg  5-10 mg Oral Q4H PRN Vernetta Lonni GRADE, MD   10 mg at 01/15/24 2135   pantoprazole  (PROTONIX ) EC tablet 40 mg  40 mg Oral Daily Vernetta Lonni GRADE, MD   40 mg at 01/16/24 1012   polyethylene glycol (MIRALAX  / GLYCOLAX ) packet 17 g  17 g Oral Daily PRN Vernetta Lonni GRADE, MD       tamsulosin  (FLOMAX ) capsule 0.4 mg  0.4 mg Oral Daily Vernetta Lonni GRADE, MD   0.4 mg at 01/16/24 1012     Discharge  Medications: Please see discharge summary for a list of discharge medications.  Relevant Imaging Results:  Relevant Lab Results:   Additional Information SSN: [id number]  Alfonse JONELLE Rex, RN

## 2024-01-16 NOTE — Plan of Care (Signed)
  Problem: Elimination: Goal: Will not experience complications related to urinary retention Outcome: Progressing   Problem: Safety: Goal: Ability to remain free from injury will improve Outcome: Progressing   Problem: Pain Management: Goal: Pain level will decrease with appropriate interventions Outcome: Progressing

## 2024-01-16 NOTE — Discharge Summary (Signed)
 Patient ID: Curtis Hansen MRN: [id number] DOB/AGE: 68-Oct-1957 68 y.o.  Admit date: 01/13/2024 Discharge date: 01/16/2024  Admission Diagnoses:  Principal Problem:   Unilateral primary osteoarthritis, right knee Active Problems:   Status post total right knee replacement   Discharge Diagnoses:  Same  Past Medical History:  Diagnosis Date   Abnormality of gait    Anxiety    Arthritis    Cognitive communication deficit    Depression    Diabetes (HCC)    Gangrene (HCC)    Hyperlipemia    Hypertension    Multiple fractures    right ribs   OSA (obstructive sleep apnea)    Repeated falls    Spinal stenosis     Surgeries: Procedure(s): ARTHROPLASTY, KNEE, TOTAL on 01/13/2024   Consultants:   Discharged Condition: Improved  Hospital Course: Curtis Hansen is an 68 y.o. male who was admitted 01/13/2024 for operative treatment ofUnilateral primary osteoarthritis, right knee. Patient has severe unremitting pain that affects sleep, daily activities, and work/hobbies. After pre-op  clearance the patient was taken to the operating room on 01/13/2024 and underwent  Procedure(s): ARTHROPLASTY, KNEE, TOTAL.    Patient was given perioperative antibiotics:  Anti-infectives (From admission, onward)    Start     Dose/Rate Route Frequency Ordered Stop   01/13/24 1700  ceFAZolin  (ANCEF ) IVPB 2g/100 mL premix        2 g 200 mL/hr over 30 Minutes Intravenous Every 6 hours 01/13/24 1430 01/14/24 0931   01/13/24 0915  ceFAZolin  (ANCEF ) IVPB 2g/100 mL premix        2 g 200 mL/hr over 30 Minutes Intravenous On call to O.R. 01/13/24 0908 01/13/24 1123        Patient was given sequential compression devices, early ambulation, and chemoprophylaxis to prevent DVT.  Inpatient Morphine  Milligram Equivalents Per Day 8/15 - 8/18   Values displayed are in units of MME/Day    Order Start / End Date 8/15 8/16 Yesterday Today    oxyCODONE  (Oxy IR/ROXICODONE ) immediate release tablet 5 mg 8/15 -  8/15 0 of Unknown -- -- --    oxyCODONE  (ROXICODONE ) 5 MG/5ML solution 5 mg 8/15 - 8/15 0 of Unknown -- -- --      Group total: 0 of Unknown       fentaNYL  (SUBLIMAZE ) injection 50-100 mcg 8/15 - 8/15 15 of 15-30 -- -- --    fentaNYL  (SUBLIMAZE ) injection 25-50 mcg 8/15 - 8/15 0 of 45-90 -- -- --    fentaNYL  (SUBLIMAZE ) injection 8/15 - 8/15 *30 of 30 -- -- --    HYDROmorphone  (DILAUDID ) injection 8/15 - 8/15 *16 of 16 -- -- --    HYDROmorphone  (DILAUDID ) injection 0.5-1 mg 8/15 - No end date 0 of 30-60 20 of 60-120 20 of 60-120 0 of 60-120    oxyCODONE  (Oxy IR/ROXICODONE ) immediate release tablet 5-10 mg 8/15 - No end date 7.5 of 22.5-45 30 of 45-90 15 of 45-90 0 of 45-90    oxyCODONE  (Oxy IR/ROXICODONE ) immediate release tablet 10-15 mg 8/15 - No end date 15 of 45-67.5 0 of 90-135 30 of 90-135 0 of 90-135    Daily Totals  * 83.5 of Unknown (at least 203.5-338.5) 50 of 195-345 65 of 195-345 0 of 195-345  *One-Step medication  Calculation Errors     Order Type Date Details   oxyCODONE  (Oxy IR/ROXICODONE ) immediate release tablet 5 mg Ordered Dose -- Insufficient frequency information   oxyCODONE  (ROXICODONE ) 5 MG/5ML solution 5 mg Ordered Dose --  Insufficient frequency information            Patient benefited maximally from hospital stay and there were no complications.    Recent vital signs: Patient Vitals for the past 24 hrs:  BP Temp Temp src Pulse Resp SpO2 Height Weight  01/16/24 0558 138/62 99.2 F (37.3 C) Oral 90 16 95 % -- --  01/15/24 2103 (!) 144/83 (!) 97.2 F (36.2 C) -- 95 15 96 % -- --  01/15/24 1515 (!) 141/77 98.5 F (36.9 C) Oral 91 16 95 % -- --  01/15/24 1000 -- -- -- -- -- -- 5' 10 (1.778 m) 115.6 kg     Recent laboratory studies:  Recent Labs    01/14/24 0324  WBC 13.7*  HGB 13.5  HCT 43.2  PLT 215  NA 135  K 4.1  CL 103  CO2 23  BUN 16  CREATININE 0.70  GLUCOSE 230*  CALCIUM  8.1*     Discharge Medications:   Allergies as of 01/16/2024        Reactions   Rosuvastatin Nausea Only   Grass Pollen([id number]-o-[id number]-t-swt Vern) Cough   Celecoxib Other (See Comments)        Medication List     TAKE these medications    acetaminophen  500 MG tablet Commonly known as: TYLENOL  Take 500 mg by mouth in the morning and at bedtime.   aspirin  EC 81 MG tablet Take 81 mg by mouth daily.   atorvastatin  40 MG tablet Commonly known as: LIPITOR Take 40 mg by mouth daily.   busPIRone  10 MG tablet Commonly known as: BUSPAR  Take 10 mg by mouth 2 (two) times daily.   clopidogrel  75 MG tablet Commonly known as: PLAVIX  Take 75 mg by mouth daily.   dextromethorphan-guaiFENesin 10-100 MG/5ML liquid Commonly known as: ROBITUSSIN-DM Take 10 mLs by mouth every 4 (four) hours as needed for cough.   diphenhydrAMINE  25 mg capsule Commonly known as: BENADRYL  Take 25 mg by mouth every 6 (six) hours as needed for itching.   Dulcolax 10 MG suppository Generic drug: bisacodyl  Place 10 mg rectally daily as needed for moderate constipation, mild constipation or severe constipation.   escitalopram  20 MG tablet Commonly known as: LEXAPRO  Take 20 mg by mouth daily.   fenofibrate  160 MG tablet Take 160 mg by mouth daily.   FreeStyle Libre 3 Sensor Misc 1 application  by Does not apply route every 14 (fourteen) days.   glipiZIDE  2.5 MG 24 hr tablet Commonly known as: GLUCOTROL  XL Take 2.5 mg by mouth daily.   hydrOXYzine  25 MG tablet Commonly known as: ATARAX  Take 25 mg by mouth every 8 (eight) hours as needed for anxiety.   lamoTRIgine  25 MG tablet Commonly known as: LAMICTAL  Take 50 mg by mouth 2 (two) times daily.   loperamide 2 MG capsule Commonly known as: IMODIUM Take 2 mg by mouth as needed for diarrhea or loose stools.   Melatonin 10 MG Tabs Take 1 tablet by mouth at bedtime.   methocarbamol  500 MG tablet Commonly known as: ROBAXIN  Take 1 tablet (500 mg total) by mouth every 6 (six) hours as needed for muscle spasms.    metoprolol  succinate 25 MG 24 hr tablet Commonly known as: TOPROL -XL Take 12.5 mg by mouth daily.   ondansetron  4 MG tablet Commonly known as: ZOFRAN  Take 4 mg by mouth every 4 (four) hours as needed for nausea or vomiting.   oxyCODONE  5 MG immediate release tablet Commonly known as: Oxy IR/ROXICODONE  Take  1-2 tablets (5-10 mg total) by mouth every 4 (four) hours as needed for moderate pain (pain score 4-6) (pain score 4-6).   polyethylene glycol 17 g packet Commonly known as: MIRALAX  / GLYCOLAX  Take 17 g by mouth every 12 (twelve) hours as needed for moderate constipation.   senna 8.6 MG Tabs tablet Commonly known as: SENOKOT Take 2 tablets by mouth 2 (two) times daily as needed for moderate constipation.   tamsulosin  0.4 MG Caps capsule Commonly known as: FLOMAX  Take 0.4 mg by mouth daily.   traMADol 50 MG tablet Commonly known as: ULTRAM Take 50 mg by mouth every 6 (six) hours as needed.               Durable Medical Equipment  (From admission, onward)           Start     Ordered   01/13/24 1431  DME 3 n 1  Once        01/13/24 1430   01/13/24 1431  DME Walker rolling  Once       Question Answer Comment  Walker: With 5 Inch Wheels   Patient needs a walker to treat with the following condition Status post total right knee replacement      01/13/24 1430            Diagnostic Studies: DG Knee Right Port Result Date: 01/13/2024 CLINICAL DATA:  Status post right knee replacement. EXAM: PORTABLE RIGHT KNEE - 1-2 VIEW COMPARISON:  None Available. FINDINGS: Right knee arthroplasty in expected alignment. No periprosthetic lucency or fracture. There has been patellar resurfacing. Recent postsurgical change includes air and edema in the soft tissues and joint space. Overlying skin staples in place. IMPRESSION: Right knee arthroplasty without immediate postoperative complication. Electronically Signed   By: Andrea Gasman M.D.   On: 01/13/2024 15:35     Disposition: Discharge disposition: 03-Skilled Nursing Facility          Follow-up Information     Vernetta Lonni GRADE, MD Follow up in 2 week(s).   Specialty: Orthopedic Surgery Contact information: [address] [place]  [place] [address] [phone]                  Signed: Lonni GRADE Vernetta 01/16/2024, 9:59 AM

## 2024-01-16 NOTE — Plan of Care (Signed)

## 2024-01-16 NOTE — Progress Notes (Signed)
 Patient ID: ANCHOR DWAN, male   DOB: May 30, 1956, 68 y.o.   MRN: [id number] The patient is awake and alert.  I did change his right knee dressing and the incision is clean and dry.  His calf is soft.  His vital signs are stable.  His mobility is significant limited but it was before surgery as well and I believe he is close to his baseline.  He can be discharged to skilled nursing today if a bed is available.  I will put in discharge summary and make sure medications are in his chart.  I am off today but can be reached through text at [phone].

## 2024-01-16 NOTE — TOC Transition Note (Addendum)
 Transition of Care [facility]) - Discharge Note   Patient Details  Name: Curtis Hansen MRN: [id number] Date of Birth: 1955-10-30  Transition of Care [facility]) CM/SW Contact:  Alfonse JONELLE Rex, RN Phone Number: 01/16/2024, 11:43 AM   Clinical Narrative:  DC to [facility] in [place], [place], [place] [address], [place] for transport. updated FL2 and dc summary faxed to [phone]. No further TOC needs identified at this time.      Final next level of care: Skilled Nursing Facility [facility] and [place]) Barriers to Discharge: Barriers Resolved   Patient Goals and CMS Choice Patient states their goals for this hospitalization and ongoing recovery are:: return to SNF CMS Medicare.gov Compare Post Acute Care list provided to:: Patient Choice offered to / list presented to : Patient [place] ownership interest in [facility].provided to:: Patient    Discharge Placement              Patient chooses bed at:  [facility] and [place]) Patient to be transferred to facility by: [facility] Name of family member notified: patient notified family Patient and family notified of of transfer: 01/16/24  Discharge Plan and Services Additional resources added to the After Visit Summary for   In-house Referral: NA Discharge Planning Services: NA Post Acute Care Choice: Skilled Nursing Facility          DME Arranged: N/A DME Agency: NA       HH Arranged: NA HH Agency: NA        Social Drivers of Health (SDOH) Interventions SDOH Screenings   Food Insecurity: No Food Insecurity (01/13/2024)  Housing: Low Risk  (01/13/2024)  Transportation Needs: No Transportation Needs (01/13/2024)  Utilities: Not At Risk (01/13/2024)  Financial Resource Strain: Not on File (03/26/2014)   Received from [facility]  Physical Activity: Not on File (01/20/2018)   Received from [facility]  Social Connections: Unknown (01/15/2024)  Stress: Not on File (03/26/2014)   Received from [facility]   Tobacco Use: Low Risk  (01/13/2024)     Readmission Risk Interventions     No data to display

## 2024-01-16 NOTE — Progress Notes (Signed)
 Attempted to call report to facility X 2, unable to speak w nurse to give report. Pt transferred via [facility] w all belongings in stable condition.

## 2024-01-26 ENCOUNTER — Encounter: Payer: Self-pay | Admitting: Orthopaedic Surgery

## 2024-01-26 ENCOUNTER — Ambulatory Visit (INDEPENDENT_AMBULATORY_CARE_PROVIDER_SITE_OTHER): Admitting: Orthopaedic Surgery

## 2024-01-26 DIAGNOSIS — Z96651 Presence of right artificial knee joint: Secondary | ICD-10-CM

## 2024-01-26 NOTE — Progress Notes (Signed)
 The patient is a 68 year old gentleman who is here today for his first postoperative visit almost 2 weeks status post a right total knee replacement to treat significant right knee pain and arthritis.  He states that the nursing facility.  Prior to surgery he had not walked in a very long amount of time.  On exam today he is in his knee immobilizer which we removed.  His staples are removed and Steri-Strips applied from his right knee and his calf is soft.  He is on Plavix  and aspirin  chronically.  The incision looks good.  His range of motion is limited and is likely where it was preoperative.  He should still work on aggressive range of motion he can weight-bear as tolerated and be up with therapy.  Will see him back in 4 weeks to see how he is doing overall but no x-rays are needed.

## 2024-02-22 ENCOUNTER — Encounter: Payer: Self-pay | Admitting: Orthopaedic Surgery

## 2024-02-22 ENCOUNTER — Ambulatory Visit (INDEPENDENT_AMBULATORY_CARE_PROVIDER_SITE_OTHER): Admitting: Orthopaedic Surgery

## 2024-02-22 DIAGNOSIS — Z96651 Presence of right artificial knee joint: Secondary | ICD-10-CM

## 2024-02-22 NOTE — Progress Notes (Signed)
 The patient is now 6 weeks status post a right total knee arthroplasty.  He does state he in a skilled nursing facility and does report he is walk 75 feet yesterday with a walker and is working on pivoting and standing.  His right knee does have a small wound at the mid area of the knee but it does not appear to be infected.  Overall though he looks good and has been in the knee well.  I would like mupirocin ointment placed to the wound daily followed by a Band-Aid.  I am going to put him on doxycycline as a precaution.  We would like to see him back in 2 weeks just for a wound check of the right knee.  He understands this as well.

## 2024-03-07 ENCOUNTER — Encounter: Payer: Self-pay | Admitting: Orthopaedic Surgery

## 2024-03-07 ENCOUNTER — Ambulatory Visit: Admitting: Orthopaedic Surgery

## 2024-03-07 DIAGNOSIS — Z96651 Presence of right artificial knee joint: Secondary | ICD-10-CM

## 2024-03-07 NOTE — Progress Notes (Signed)
 The patient comes in for wound check of his right total knee that we did over 6 weeks ago.  I was concerned about a little area of dehiscence.  We have had him on mupirocin ointment daily and doxycycline.  The wound itself looks good.  Is healing over nicely and they are continue to take good care of it at a skilled nursing facility.  From our standpoint we will see him back in a month just to make sure he is doing well.  We will have an AP and lateral of his right knee at that visit.

## 2024-03-14 ENCOUNTER — Encounter (INDEPENDENT_AMBULATORY_CARE_PROVIDER_SITE_OTHER): Payer: Self-pay | Admitting: Gastroenterology

## 2024-04-02 ENCOUNTER — Encounter: Payer: Self-pay | Admitting: Radiology

## 2024-04-04 ENCOUNTER — Encounter: Payer: Self-pay | Admitting: Orthopaedic Surgery

## 2024-04-04 ENCOUNTER — Other Ambulatory Visit: Payer: Self-pay

## 2024-04-04 ENCOUNTER — Ambulatory Visit: Admitting: Orthopaedic Surgery

## 2024-04-04 DIAGNOSIS — Z96651 Presence of right artificial knee joint: Secondary | ICD-10-CM

## 2024-04-04 NOTE — Progress Notes (Signed)
 The patient comes in today at 10 weeks status post a right total knee arthroplasty.  He is someone who has not walked in 2 years so he had significant deconditioning.  We want to make sure the knee is doing well.  There was a small area of a suture dehiscence at his last visit and they have been doing local wound care at his skilled nursing facility.  He says he is doing well and is planning to relocate soon to assisted living in [place].  The right operative knee looks good today.  The small area of wound is scabbed over nicely and there is no redness.  There is good range of motion of the knee as well.  X-rays of the right knee today show well-seated total knee arthroplasty with no complicating features.  He will continue to increase his activities as comfort allows.  Will see him back in 3 months to assess his knee again but no x-rays are needed.  If there are issues before then they need to let us  know.

## 2024-06-12 ENCOUNTER — Telehealth: Payer: Self-pay

## 2024-06-12 NOTE — Telephone Encounter (Signed)
 Dysuria  Patient called with c/o dysuria x 2 weeks  Pain: intermittent and burning  Severity:6/10  Associated Signs and Symptoms:  Fever: no Chills: no Hematuria: no Urgency: yes Frequency: yes Hesitancy:yes Incontinence: yes Nausea: no Vomiting: no  Urologic History:  Any Recent Urologic Surgeries or Procedures:TURPT (early 46s) Recurrent UTI's:yes Cystitis: no  Prostatitis:yes (previously) Kidney or Bladder Stones: no Plan: Walk-in Clinic: no Appointment w/Physician: [no Lab visit scheduled for urine drop off: Yes Advice given: patient stated [facility] [place] and is having constant UTI and is unable to come do a urine drop off today, patient was advised to have the facility where he is to do a ua and culture and fax it to our office or ask how soon in advise does he need to give them an appointment to be scheduled for a possible ua drop off patient stated he will do one of the two and call back    Do you take on daily medications for UTI suppression No

## 2024-06-20 NOTE — Telephone Encounter (Signed)
 Pt called about getting a sooner appointment due to having recurring UTI. Pt want's a sooner appointment than 07/16/2024. Pt is advised his labs were routed to [facility] [place] to determined if pt need to be seen sooner. Voiced understanding

## 2024-06-23 ENCOUNTER — Encounter: Payer: Self-pay | Admitting: Orthopaedic Surgery

## 2024-06-27 NOTE — Telephone Encounter (Signed)
 Pt was made aware and voiced understanding  We have never seen the patient, so we cannot evaluate any labs. If he has any acute issues he needs to contact his PCP. If he has any painful inability to urinate he should go to the emergency room. Otherwise he can see us  as a new patient as planned.

## 2024-07-05 ENCOUNTER — Encounter: Payer: Self-pay | Admitting: Orthopaedic Surgery

## 2024-07-05 ENCOUNTER — Ambulatory Visit: Admitting: Orthopaedic Surgery

## 2024-07-05 DIAGNOSIS — Z96651 Presence of right artificial knee joint: Secondary | ICD-10-CM

## 2024-07-05 NOTE — Progress Notes (Signed)
 The patient is now 6 months status post a right total knee replacement.  He is someone who has limited mobility.  He is 69 years old.  We did x-ray his knee in November and everything looked good.  He said he did have a fall in 2 weeks after he fell in the shower they did x-ray his knee and did not show any fracture.  He also had a Doppler to rule out DVT.  He still has chronic knee swelling and leg swelling.  However a lot of this I think is related to his decreased mobility.  He had more therapy when he was in skilled nursing but now has moved to different facility with assisted living.  On exam his knee incision has healed nicely on the right side.  He lacks full extension by 3 to 5 degrees and active flexion to past 90 degrees.  His leg is globally swollen but his calf is soft.  He understands the importance of mobility.  I did write an order for compressive socks for him.  Elevation will also help with mainly points, help his hip and pumping his feet and mobilizing is much as possible.  He would benefit still from therapy over there 2-3 times a week.  From our standpoint the next time we need to see him is not for 6 months unless there are issues.  Will have an AP and lateral of his right knee at that visit.

## 2024-07-16 ENCOUNTER — Ambulatory Visit: Admitting: Urology

## 2024-07-24 ENCOUNTER — Ambulatory Visit: Admitting: Orthopedic Surgery

## 2025-01-03 ENCOUNTER — Ambulatory Visit: Admitting: Orthopaedic Surgery
